# Patient Record
Sex: Female | Born: 1985 | Hispanic: No | Marital: Married | Smoking: Never smoker
Health system: Southern US, Community
[De-identification: ages and names within clinical notes are randomized; demographics above are authoritative.]

## PROBLEM LIST (undated history)

## (undated) DIAGNOSIS — E785 Hyperlipidemia, unspecified: Secondary | ICD-10-CM

## (undated) HISTORY — PX: NO PAST SURGERIES: SHX2092

## (undated) HISTORY — DX: Hyperlipidemia, unspecified: E78.5

---

## 2015-07-19 ENCOUNTER — Encounter (HOSPITAL_COMMUNITY): Payer: Self-pay | Admitting: Emergency Medicine

## 2015-07-19 ENCOUNTER — Emergency Department (HOSPITAL_COMMUNITY)
Admission: EM | Admit: 2015-07-19 | Discharge: 2015-07-20 | Disposition: A | Discharge status: Home / Self Care | Payer: PPO | Attending: Emergency Medicine | Admitting: Emergency Medicine

## 2015-07-19 DIAGNOSIS — J069 Acute upper respiratory infection, unspecified: Secondary | ICD-10-CM | POA: Diagnosis not present

## 2015-07-19 DIAGNOSIS — J9801 Acute bronchospasm: Secondary | ICD-10-CM | POA: Insufficient documentation

## 2015-07-19 DIAGNOSIS — R0602 Shortness of breath: Secondary | ICD-10-CM | POA: Diagnosis present

## 2015-07-19 NOTE — ED Provider Notes (Signed)
CSN: [id number]   Arrival date & time 07/19/15 2346  History  By signing my name below, I, [name], attest that this documentation has been prepared under the direction and in the presence of Julianne Rice, MD. Electronically Signed: [name], ED Scribe. 07/20/2015. 12:14 AM.  Chief Complaint  Patient presents with  . Cough  . Shortness of Breath    HPI The history is provided by the patient. No language interpreter was used.   Krystal Cook is a 29 y.o. female who presents to the Emergency Department complaining of constant SOB with onset tonight before bed. Associated symptoms include 1 day of dry cough, and sore throat. Pt denies fever and chills. No lower extremity swelling or pain.No known sick contact. Healthy otherwise.    History reviewed. No pertinent past medical history.  History reviewed. No pertinent past surgical history.  History reviewed. No pertinent family history.  Social History  Substance Use Topics  . Smoking status: Never Smoker   . Smokeless tobacco: None  . Alcohol Use: No     Review of Systems  Constitutional: Negative for fever and chills.  HENT: Positive for congestion and sore throat. Negative for sinus pressure.   Respiratory: Positive for cough and shortness of breath. Negative for wheezing.   Cardiovascular: Negative for chest pain.  Gastrointestinal: Negative for nausea, vomiting, abdominal pain, diarrhea and constipation.  Musculoskeletal: Negative for back pain, neck pain and neck stiffness.  Skin: Negative for rash and wound.  Neurological: Negative for dizziness, syncope, weakness, light-headedness, numbness and headaches.  All other systems reviewed and are negative.  Home Medications   Prior to Admission medications   Medication Sig Start Date End Date Taking? Authorizing Provider  OVER THE COUNTER MEDICATION Take 1 tablet by mouth daily.   Yes Historical Provider, MD  predniSONE (DELTASONE) 20 MG tablet 3 tabs po day  one, then 2 po daily x 4 days 07/20/15   Julianne Rice, MD    Allergies  Review of patient's allergies indicates no known allergies.  Triage Vitals: BP 117/64 mmHg  Pulse 73  Temp(Src) 97.8 F (36.6 C) (Oral)  Resp 16  SpO2 100%  Physical Exam  Constitutional: She is oriented to person, place, and time. She appears well-developed and well-nourished. No distress.  HENT:  Head: Normocephalic and atraumatic.  Mouth/Throat: Oropharynx is clear and moist. No oropharyngeal exudate.  Mildly erythematous bilaterally tonsillar hypertrophy. Bilateral nasal mucosal edema.  Eyes: EOM are normal. Pupils are equal, round, and reactive to light.  Neck: Normal range of motion. Neck supple.  Cardiovascular: Normal rate and regular rhythm.  Exam reveals no gallop and no friction rub.   No murmur heard. Pulmonary/Chest: Effort normal and breath sounds normal. No respiratory distress. She has no wheezes. She has no rales. She exhibits no tenderness.  Abdominal: Soft. Bowel sounds are normal. She exhibits no distension and no mass. There is no tenderness. There is no rebound and no guarding.  Musculoskeletal: Normal range of motion. She exhibits no edema or tenderness.  No calf swelling or tenderness.  Lymphadenopathy:    She has no cervical adenopathy.  Neurological: She is alert and oriented to person, place, and time.  Skin: Skin is warm and dry. No rash noted. No erythema.  Psychiatric: She has a normal mood and affect. Her behavior is normal.  Nursing note and vitals reviewed.   ED Course  Procedures   DIAGNOSTIC STUDIES: Oxygen Saturation is 100% on RA, normal by my interpretation.  COORDINATION OF CARE: 12:04 AM Discussed treatment plan which includes CXR with pt at bedside and pt agreed to plan.  Labs Reviewed - No data to display  Imaging Review Dg Chest 2 View  07/20/2015  CLINICAL DATA:  Sudden onset shortness of breath this morning. Cough and sore throat for 24 hours.  EXAM: CHEST  2 VIEW COMPARISON:  None. FINDINGS: The heart size and mediastinal contours are within normal limits. Both lungs are clear. The visualized skeletal structures are unremarkable. IMPRESSION: No active cardiopulmonary disease. Electronically Signed   By: Lucienne Capers M.D.   On: 07/20/2015 00:29    I personally reviewed and evaluated these images as a part of my medical decision-making.   MDM   Final diagnoses:  Bronchospasm  URI (upper respiratory infection)     I, [name], personally performed the services described in this documentation. All medical record entries made by the scribe were at my direction and in my presence.  I have reviewed the chart and discharge instructions and agree that the record reflects my personal performance and is accurate and complete. [place].  07/20/2015. 1:19 AM.   Chest x-ray without any evidence of pneumonia. Patient given epi oral nap in the emergency department with improvement of her symptoms. Shortness of breath likely due to bronchospasm. We'll treat with short course of steroids and given albuterol inhaler in the emergency department. Return precautions have been given. No suspicion for PE given infectious symptoms, normal pulse and normal oxygen saturations.   Julianne Rice, MD 07/20/15 [phone]

## 2015-07-19 NOTE — ED Notes (Signed)
Pt's friend states she "woke up and couldn't breathe." Pt on her phone during entire triage and would not answer questions related to why she presents to ED. Denies being on contraceptives measures. Has had cough recently. Unable to obtain other triage information. No other c/c. RR even/unlabored. No wheezing noted.

## 2015-07-20 ENCOUNTER — Emergency Department (HOSPITAL_COMMUNITY): Payer: PPO

## 2015-07-20 MED ORDER — PREDNISONE 20 MG PO TABS: ORAL_TABLET | ORAL | Status: DC

## 2015-07-20 MED ORDER — ALBUTEROL SULFATE (2.5 MG/3ML) 0.083% IN NEBU
2.5000 mg | INHALATION_SOLUTION | Freq: Once | RESPIRATORY_TRACT | Status: AC
  Administered 2015-07-20: 2.5 mg via RESPIRATORY_TRACT
  Filled 2015-07-20: qty 3

## 2015-07-20 MED ORDER — ALBUTEROL SULFATE HFA 108 (90 BASE) MCG/ACT IN AERS
1.0000 | INHALATION_SPRAY | RESPIRATORY_TRACT | Status: DC | PRN
  Administered 2015-07-20: 2 via RESPIRATORY_TRACT
  Filled 2015-07-20: qty 6.7

## 2015-07-20 NOTE — Discharge Instructions (Signed)
Bronchospasm, Adult A bronchospasm is a spasm or tightening of the airways going into the lungs. During a bronchospasm breathing becomes more difficult because the airways get smaller. When this happens there can be coughing, a whistling sound when breathing (wheezing), and difficulty breathing. Bronchospasm is often associated with asthma, but not all patients who experience a bronchospasm have asthma. CAUSES  A bronchospasm is caused by inflammation or irritation of the airways. The inflammation or irritation may be triggered by:   Allergies (such as to animals, pollen, food, or mold). Allergens that cause bronchospasm may cause wheezing immediately after exposure or many hours later.   Infection. Viral infections are believed to be the most common cause of bronchospasm.   Exercise.   Irritants (such as pollution, cigarette smoke, strong odors, aerosol sprays, and paint fumes).   Weather changes. Winds increase molds and pollens in the air. Rain refreshes the air by washing irritants out. Cold air may cause inflammation.   Stress and emotional upset.  SIGNS AND SYMPTOMS   Wheezing.   Excessive nighttime coughing.   Frequent or severe coughing with a simple cold.   Chest tightness.   Shortness of breath.  DIAGNOSIS  Bronchospasm is usually diagnosed through a history and physical exam. Tests, such as chest X-rays, are sometimes done to look for other conditions. TREATMENT   Inhaled medicines can be given to open up your airways and help you breathe. The medicines can be given using either an inhaler or a nebulizer machine.  Corticosteroid medicines may be given for severe bronchospasm, usually when it is associated with asthma. HOME CARE INSTRUCTIONS   Always have a plan prepared for seeking medical care. Know when to call your health care provider and local emergency services (911 in the [place] Know where you can access local emergency care.  Only take medicines as  directed by your health care provider.  If you were prescribed an inhaler or nebulizer machine, ask your health care provider to explain how to use it correctly. Always use a spacer with your inhaler if you were given one.  It is necessary to remain calm during an attack. Try to relax and breathe more slowly.  Control your home environment in the following ways:   Change your heating and air conditioning filter at least once a month.   Limit your use of fireplaces and wood stoves.  Do not smoke and do not allow smoking in your home.   Avoid exposure to perfumes and fragrances.   Get rid of pests (such as roaches and mice) and their droppings.   Throw away plants if you see mold on them.   Keep your house clean and dust free.   Replace carpet with wood, tile, or vinyl flooring. Carpet can trap dander and dust.   Use allergy-proof pillows, mattress covers, and box spring covers.   Wash bed sheets and blankets every week in hot water and dry them in a dryer.   Use blankets that are made of polyester or cotton.   Wash hands frequently. SEEK MEDICAL CARE IF:   You have muscle aches.   You have chest pain.   The sputum changes from clear or white to yellow, green, gray, or bloody.   The sputum you cough up gets thicker.   There are problems that may be related to the medicine you are given, such as a rash, itching, swelling, or trouble breathing.  SEEK IMMEDIATE MEDICAL CARE IF:   You have worsening wheezing and coughing  even after taking your prescribed medicines.   You have increased difficulty breathing.   You develop severe chest pain. MAKE SURE YOU:   Understand these instructions.  Will watch your condition.  Will get help right away if you are not doing well or get worse.   This information is not intended to replace advice given to you by your health care provider. Make sure you discuss any questions you have with your health care  provider.   Document Released: 09/25/2003 Document Revised: 10/13/2014 Document Reviewed: 03/14/2013 Elsevier Interactive Patient Education 2016 Elsevier Inc.  Upper Respiratory Infection, Adult Most upper respiratory infections (URIs) are a viral infection of the air passages leading to the lungs. A URI affects the nose, throat, and upper air passages. The most common type of URI is nasopharyngitis and is typically referred to as "the common cold." URIs run their course and usually go away on their own. Most of the time, a URI does not require medical attention, but sometimes a bacterial infection in the upper airways can follow a viral infection. This is called a secondary infection. Sinus and middle ear infections are common types of secondary upper respiratory infections. Bacterial pneumonia can also complicate a URI. A URI can worsen asthma and chronic obstructive pulmonary disease (COPD). Sometimes, these complications can require emergency medical care and may be life threatening.  CAUSES Almost all URIs are caused by viruses. A virus is a type of germ and can spread from one person to another.  RISKS FACTORS You may be at risk for a URI if:   You smoke.   You have chronic heart or lung disease.  You have a weakened defense (immune) system.   You are very young or very old.   You have nasal allergies or asthma.  You work in crowded or poorly ventilated areas.  You work in health care facilities or schools. SIGNS AND SYMPTOMS  Symptoms typically develop 2-3 days after you come in contact with a cold virus. Most viral URIs last 7-10 days. However, viral URIs from the influenza virus (flu virus) can last 14-18 days and are typically more severe. Symptoms may include:   Runny or stuffy (congested) nose.   Sneezing.   Cough.   Sore throat.   Headache.   Fatigue.   Fever.   Loss of appetite.   Pain in your forehead, behind your eyes, and over your cheekbones  (sinus pain).  Muscle aches.  DIAGNOSIS  Your health care provider may diagnose a URI by:  Physical exam.  Tests to check that your symptoms are not due to another condition such as:  Strep throat.  Sinusitis.  Pneumonia.  Asthma. TREATMENT  A URI goes away on its own with time. It cannot be cured with medicines, but medicines may be prescribed or recommended to relieve symptoms. Medicines may help:  Reduce your fever.  Reduce your cough.  Relieve nasal congestion. HOME CARE INSTRUCTIONS   Take medicines only as directed by your health care provider.   Gargle warm saltwater or take cough drops to comfort your throat as directed by your health care provider.  Use a warm mist humidifier or inhale steam from a shower to increase air moisture. This may make it easier to breathe.  Drink enough fluid to keep your urine clear or pale yellow.   Eat soups and other clear broths and maintain good nutrition.   Rest as needed.   Return to work when your temperature has returned to normal  or as your health care provider advises. You may need to stay home longer to avoid infecting others. You can also use a face mask and careful hand washing to prevent spread of the virus.  Increase the usage of your inhaler if you have asthma.   Do not use any tobacco products, including cigarettes, chewing tobacco, or electronic cigarettes. If you need help quitting, ask your health care provider. PREVENTION  The best way to protect yourself from getting a cold is to practice good hygiene.   Avoid oral or hand contact with people with cold symptoms.   Wash your hands often if contact occurs.  There is no clear evidence that vitamin C, vitamin E, echinacea, or exercise reduces the chance of developing a cold. However, it is always recommended to get plenty of rest, exercise, and practice good nutrition.  SEEK MEDICAL CARE IF:   You are getting worse rather than better.   Your  symptoms are not controlled by medicine.   You have chills.  You have worsening shortness of breath.  You have brown or red mucus.  You have yellow or brown nasal discharge.  You have pain in your face, especially when you bend forward.  You have a fever.  You have swollen neck glands.  You have pain while swallowing.  You have white areas in the back of your throat. SEEK IMMEDIATE MEDICAL CARE IF:   You have severe or persistent:  Headache.  Ear pain.  Sinus pain.  Chest pain.  You have chronic lung disease and any of the following:  Wheezing.  Prolonged cough.  Coughing up blood.  A change in your usual mucus.  You have a stiff neck.  You have changes in your:  Vision.  Hearing.  Thinking.  Mood. MAKE SURE YOU:   Understand these instructions.  Will watch your condition.  Will get help right away if you are not doing well or get worse.   This information is not intended to replace advice given to you by your health care provider. Make sure you discuss any questions you have with your health care provider.   Document Released: 03/18/2001 Document Revised: 02/06/2015 Document Reviewed: 12/28/2013 Elsevier Interactive Patient Education [employer].

## 2015-08-09 ENCOUNTER — Other Ambulatory Visit (HOSPITAL_COMMUNITY)
Admission: RE | Admit: 2015-08-09 | Discharge: 2015-08-09 | Disposition: A | Discharge status: Home / Self Care | Payer: PPO | Source: Ambulatory Visit | Attending: Nurse Practitioner | Admitting: Nurse Practitioner

## 2015-08-09 DIAGNOSIS — Z01419 Encounter for gynecological examination (general) (routine) without abnormal findings: Secondary | ICD-10-CM | POA: Diagnosis present

## 2015-08-09 DIAGNOSIS — Z113 Encounter for screening for infections with a predominantly sexual mode of transmission: Secondary | ICD-10-CM | POA: Diagnosis present

## 2017-06-04 ENCOUNTER — Ambulatory Visit (INDEPENDENT_AMBULATORY_CARE_PROVIDER_SITE_OTHER): Payer: PPO | Admitting: Family Medicine

## 2017-06-04 ENCOUNTER — Encounter: Payer: Self-pay | Admitting: Family Medicine

## 2017-06-04 VITALS — BP 110/70 | HR 81 | Temp 98.3°F | Ht 59.0 in | Wt 146.6 lb

## 2017-06-04 DIAGNOSIS — Z833 Family history of diabetes mellitus: Secondary | ICD-10-CM | POA: Insufficient documentation

## 2017-06-04 DIAGNOSIS — F4320 Adjustment disorder, unspecified: Secondary | ICD-10-CM

## 2017-06-04 DIAGNOSIS — R635 Abnormal weight gain: Secondary | ICD-10-CM | POA: Diagnosis not present

## 2017-06-04 DIAGNOSIS — R5383 Other fatigue: Secondary | ICD-10-CM | POA: Diagnosis not present

## 2017-06-04 DIAGNOSIS — E785 Hyperlipidemia, unspecified: Secondary | ICD-10-CM | POA: Insufficient documentation

## 2017-06-04 DIAGNOSIS — R350 Frequency of micturition: Secondary | ICD-10-CM

## 2017-06-04 DIAGNOSIS — F4321 Adjustment disorder with depressed mood: Secondary | ICD-10-CM | POA: Insufficient documentation

## 2017-06-04 LAB — CBC WITH DIFFERENTIAL/PLATELET
BASOS ABS: 0 {cells}/uL (ref 0–200)
Basophils Relative: 0 %
EOS ABS: 212 {cells}/uL (ref 15–500)
Eosinophils Relative: 2 %
HCT: 37.7 % (ref 35.0–45.0)
Hemoglobin: 12.4 g/dL (ref 11.7–15.5)
Lymphocytes Relative: 27 %
Lymphs Abs: 2862 cells/uL (ref 850–3900)
MCH: 29 pg (ref 27.0–33.0)
MCHC: 32.9 g/dL (ref 32.0–36.0)
MCV: 88.1 fL (ref 80.0–100.0)
MONOS PCT: 7 %
MPV: 9.7 fL (ref 7.5–12.5)
Monocytes Absolute: 742 cells/uL (ref 200–950)
Neutro Abs: 6784 cells/uL (ref 1500–7800)
Neutrophils Relative %: 64 %
PLATELETS: 451 10*3/uL — AB (ref 140–400)
RBC: 4.28 MIL/uL (ref 3.80–5.10)
RDW: 14.2 % (ref 11.0–15.0)
WBC: 10.6 10*3/uL — ABNORMAL HIGH (ref 4.0–10.5)

## 2017-06-04 LAB — POCT URINALYSIS DIP (PROADVANTAGE DEVICE)
BILIRUBIN UA: NEGATIVE mg/dL
Bilirubin, UA: NEGATIVE
Glucose, UA: NEGATIVE mg/dL
LEUKOCYTES UA: NEGATIVE
NITRITE UA: NEGATIVE
PH UA: 6 (ref 5.0–8.0)
Protein Ur, POC: NEGATIVE mg/dL
RBC UA: NEGATIVE
SPECIFIC GRAVITY, URINE: 1.025
UUROB: NEGATIVE

## 2017-06-04 NOTE — Progress Notes (Signed)
   Subjective:    Patient ID: Krystal Cook, female    DOB: August 20, 1986, 31 y.o.   MRN: [id number]  HPI Chief Complaint  Patient presents with  . new pt    new pt not feel well. been going on for a long time but within the last 2 weeks, exhausted   She is new to the practice and here with complaints of fatigue. States she went to a clinic last year for the same complaint.  She is originally from [place].   States she sleeps well but wakes up feeling tired.   States her diet is poor and eats a lot of fast food. Does not exercise. States she has gained 20 lbs since moving to the [place] 4 years ago.   Diabetes runs in her family and she is concerned she might have diabetes. Reports urinary frequency.   States her brother died this year and she has not dealt with his passing. She is quite sad today. Denies SI or HI.   LMP: 1 week ago and regular.   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 Has been at [employer] for 4 years working on her Masters.    Denies fever, chills, dizziness, chest pain, palpitations, shortness of breath, abdominal pain, N/V/D, LE edema.    Depression screen PHQ 2/9 06/04/2017  Decreased Interest 0  Down, Depressed, Hopeless 0  PHQ - 2 Score 0   Reviewed allergies, medications, past medical, surgical, family, and social history.    Review of Systems Pertinent positives and negatives in the history of present illness.     Objective:   Physical Exam BP 110/70   Pulse 81   Temp 98.3 F (36.8 C) (Oral)   Ht 4\' 11"  (1.499 m)   Wt 146 lb 9.6 oz (66.5 kg)   LMP 05/28/2017   SpO2 99%   BMI 29.61 kg/m   Alert and in no distress.  Pharyngeal area is normal. Neck is supple without adenopathy or thyromegaly. Cardiac exam shows a regular sinus rhythm without murmurs or gallops. Lungs are clear to auscultation. Extremities without edema.       Assessment & Plan:  Fatigue, unspecified type - Plan: CBC with Differential/Platelet,  Comprehensive metabolic panel, VITAMIN D 25 Hydroxy (Vit-D Deficiency, Fractures), TSH, Hemoglobin A1c  Hyperlipidemia, unspecified hyperlipidemia type - Plan: Lipid panel  Grief reaction  Urinary frequency - Plan: POCT Urinalysis DIP (Proadvantage Device), Hemoglobin A1c  Family history of diabetes mellitus in first degree relative - Plan: Hemoglobin A1c  Weight gain - Plan: Comprehensive metabolic panel, TSH  Discussed possible etiologies for fatigue including grief and depression. Will check labs. Discussed taking better care of herself by eating a healthy diet and avoiding fast food. Advised to increase exercise.  Recommend that she start counseling to deal with grief from loss of her brother.  No sign of UTI. Will check for diabetes based on family history of urinary frequency. Weight gain appears to be due to poor diet since moving here for school.  Will follow up pending labs.

## 2017-06-04 NOTE — Patient Instructions (Signed)
You can call to schedule your appointment with the counselor. A few offices are listed below for you to call.   [facility]  [place], [place], [place] [address]  Phone: [phone]  [facility] [address] [place], [place] [address]  Phone: [phone]  [facility]  [address], [place], [place] [address] Phone: [phone]

## 2017-06-05 ENCOUNTER — Encounter: Payer: Self-pay | Admitting: Family Medicine

## 2017-06-05 ENCOUNTER — Other Ambulatory Visit: Payer: Self-pay | Admitting: Family Medicine

## 2017-06-05 DIAGNOSIS — E559 Vitamin D deficiency, unspecified: Secondary | ICD-10-CM

## 2017-06-05 DIAGNOSIS — D75839 Thrombocytosis, unspecified: Secondary | ICD-10-CM | POA: Insufficient documentation

## 2017-06-05 DIAGNOSIS — D473 Essential (hemorrhagic) thrombocythemia: Secondary | ICD-10-CM | POA: Insufficient documentation

## 2017-06-05 LAB — HEMOGLOBIN A1C
HEMOGLOBIN A1C: 5.1 % (ref <5.7)
Mean Plasma Glucose: 100 mg/dL

## 2017-06-05 LAB — COMPREHENSIVE METABOLIC PANEL
ALBUMIN: 4.2 g/dL (ref 3.6–5.1)
ALT: 12 U/L (ref 6–29)
AST: 19 U/L (ref 10–30)
Alkaline Phosphatase: 94 U/L (ref 33–115)
BUN: 11 mg/dL (ref 7–25)
CO2: 23 mmol/L (ref 20–32)
Calcium: 9.4 mg/dL (ref 8.6–10.2)
Chloride: 102 mmol/L (ref 98–110)
Creat: 0.63 mg/dL (ref 0.50–1.10)
Glucose, Bld: 91 mg/dL (ref 65–99)
POTASSIUM: 4.2 mmol/L (ref 3.5–5.3)
Sodium: 139 mmol/L (ref 135–146)
TOTAL PROTEIN: 7 g/dL (ref 6.1–8.1)
Total Bilirubin: 0.2 mg/dL (ref 0.2–1.2)

## 2017-06-05 LAB — LIPID PANEL
Cholesterol: 184 mg/dL (ref <200)
HDL: 65 mg/dL (ref >50)
LDL CALC: 97 mg/dL (ref <100)
Total CHOL/HDL Ratio: 2.8 Ratio (ref <5.0)
Triglycerides: 111 mg/dL (ref <150)
VLDL: 22 mg/dL (ref <30)

## 2017-06-05 LAB — TSH: TSH: 1.17 mIU/L

## 2017-06-05 LAB — VITAMIN D 25 HYDROXY (VIT D DEFICIENCY, FRACTURES): Vit D, 25-Hydroxy: 15 ng/mL — ABNORMAL LOW (ref 30–100)

## 2017-06-05 MED ORDER — VITAMIN D (ERGOCALCIFEROL) 1.25 MG (50000 UNIT) PO CAPS: 50000.0000 [IU] | ORAL_CAPSULE | ORAL | 0 refills | Status: DC

## 2017-06-11 ENCOUNTER — Encounter: Payer: Self-pay | Admitting: Family Medicine

## 2017-06-11 ENCOUNTER — Ambulatory Visit (INDEPENDENT_AMBULATORY_CARE_PROVIDER_SITE_OTHER): Payer: PPO | Admitting: Family Medicine

## 2017-06-11 VITALS — BP 110/60 | HR 98 | Wt 145.8 lb

## 2017-06-11 DIAGNOSIS — R140 Abdominal distension (gaseous): Secondary | ICD-10-CM | POA: Diagnosis not present

## 2017-06-11 DIAGNOSIS — K5900 Constipation, unspecified: Secondary | ICD-10-CM | POA: Diagnosis not present

## 2017-06-11 DIAGNOSIS — F4320 Adjustment disorder, unspecified: Secondary | ICD-10-CM

## 2017-06-11 DIAGNOSIS — M2141 Flat foot [pes planus] (acquired), right foot: Secondary | ICD-10-CM | POA: Diagnosis not present

## 2017-06-11 DIAGNOSIS — M79671 Pain in right foot: Secondary | ICD-10-CM | POA: Diagnosis not present

## 2017-06-11 DIAGNOSIS — F4321 Adjustment disorder with depressed mood: Secondary | ICD-10-CM

## 2017-06-11 DIAGNOSIS — E559 Vitamin D deficiency, unspecified: Secondary | ICD-10-CM | POA: Diagnosis not present

## 2017-06-11 NOTE — Patient Instructions (Addendum)
If your stools are hard then try adding fiber con or a similar medication and stool softener such as Colace or the generic. You can ask the pharmacy staff to help picking these out.  Eat small meals and do not overload your stomach. Be aware of gas producing foods such as raw vegetables, onions for example.  Increase water intake and increase your physical activity.   The podiatrist (foot doctor) will call you to schedule an appointment.   Start taking your vitamin D once weekly and return for a lab visit in 9 weeks.    Abdominal Bloating When you have abdominal bloating, your abdomen may feel full, tight, or painful. It may also look bigger than normal or swollen (distended). Common causes of abdominal bloating include:  Swallowing air.  Constipation.  Problems digesting food.  Eating too much.  Irritable bowel syndrome. This is a condition that affects the large intestine.  Lactose intolerance. This is an inability to digest lactose, a natural sugar in dairy products.  Celiac disease. This is a condition that affects the ability to digest gluten, a protein found in some grains.  Gastroparesis. This is a condition that slows down the movement of food in the stomach and small intestine. It is more common in people with diabetes mellitus.  Gastroesophageal reflux disease (GERD). This is a digestive condition that makes stomach acid flow back into the esophagus.  Urinary retention. This means that the body is holding onto urine, and the bladder cannot be emptied all the way.  Follow these instructions at home: Eating and drinking  Avoid eating too much.  Try not to swallow air while talking or eating.  Avoid eating while lying down.  Avoid these foods and drinks: ? Foods that cause gas, such as broccoli, cabbage, cauliflower, and baked beans. ? Carbonated drinks. ? Hard candy. ? Chewing gum. Medicines  Take over-the-counter and prescription medicines only as told by your  health care provider.  Take probiotic medicines. These medicines contain live bacteria or yeasts that can help digestion.  Take coated peppermint oil capsules. Activity  Try to exercise regularly. Exercise may help to relieve bloating that is caused by gas and relieve constipation. General instructions  Keep all follow-up visits as told by your health care provider. This is important. Contact a health care provider if:  You have nausea and vomiting.  You have diarrhea.  You have abdominal pain.  You have unusual weight loss or weight gain.  You have severe pain, and medicines do not help. Get help right away if:  You have severe chest pain.  You have trouble breathing.  You have shortness of breath.  You have trouble urinating.  You have darker urine than normal.  You have blood in your stools or have dark, tarry stools. Summary  Abdominal bloating means that the abdomen is swollen.  Common causes of abdominal bloating are swallowing air, constipation, and problems digesting food.  Avoid eating too much and avoid swallowing air.  Avoid foods that cause gas, carbonated drinks, hard candy, and chewing gum. This information is not intended to replace advice given to you by your health care provider. Make sure you discuss any questions you have with your health care provider. Document Released: 10/24/2016 Document Revised: 10/24/2016 Document Reviewed: 10/24/2016 Elsevier Interactive Patient Education  [employer].

## 2017-06-11 NOTE — Progress Notes (Signed)
   Subjective:    Patient ID: Krystal Cook, female    DOB: [birth year], 31 y.o.   MRN: [id number]  HPI Chief Complaint  Patient presents with  . follow-up    follow-up on blood work   States she is here to follow up on her abnormal labs.  Her vitamin D is low and she has not started taking vitamin D supplement yet. She plans to pick up this prescription today.   She complains of right foot pain for several years. States she has flat feet that is worse with her right foot. States she has been wearing arch supports but certain activities make her pain worse. She would like to see a foot specialist.   Also complains of feeling bloated and having a lot of gas. This has been ongoing for at least a year but seems to be getting worse. States her stools have been harder and she is having constipation issues. States she does not eat a lot of fiber. States her diet is high in rice, garlic and onions. Denies nausea, vomiting, blood in stool. No urinary symptoms.  Denies ever being sexually active and no chance of pregnancy.   She is still dealing with the recent death of her brother. Has not scheduled a counseling appointment yet. States she is feeling less sad and keeping busy. She may see a counselor but is not sure.   Denies fever, chills, dizziness, chest pain, palpitations, shortness of breath, abdominal pain, N/V/D.     Review of Systems Pertinent positives and negatives in the history of present illness.     Objective:   Physical Exam  Constitutional: She appears well-developed and well-nourished. No distress.  Neck: Normal range of motion. Neck supple.  Cardiovascular: Normal rate, regular rhythm and intact distal pulses.   Pulmonary/Chest: Effort normal and breath sounds normal.  Abdominal: Soft. Bowel sounds are normal. She exhibits no distension. There is no hepatosplenomegaly. There is no tenderness. There is no rigidity, no rebound, no guarding, no CVA tenderness, no tenderness at  McBurney's point and negative Murphy's sign.  Musculoskeletal:       Right foot: There is normal range of motion, no tenderness and normal capillary refill.  Pes planus but otherwise normal exam of right foot.   Skin: Skin is warm and dry. No rash noted. No pallor.  Psychiatric: She has a normal mood and affect. Her speech is normal and behavior is normal. Thought content normal.   BP 110/60   Pulse 98   Wt 145 lb 12.8 oz (66.1 kg)   LMP 05/28/2017   BMI 29.45 kg/m      Assessment & Plan:  Vitamin D deficiency  Grief  Pes planus of right foot - Plan: Ambulatory referral to Podiatry  Bloating  Constipation, unspecified constipation type  Right foot pain - Plan: Ambulatory referral to Podiatry  She will start vitamin D supplement and f/u in 9 weeks.  Her mood appears to be better. She may call and schedule a counseling visit, she has the information.  Referral to podiatry for right foot pain and pes planus per patient request.  Discussed avoiding gas producing foods, increasing water and physical activity and trying Gas-X or stool softeners, fiber con.  She will follow up for lab visit and let me know if her symptoms are worsening or not improving.

## 2017-07-03 ENCOUNTER — Ambulatory Visit (INDEPENDENT_AMBULATORY_CARE_PROVIDER_SITE_OTHER): Payer: PPO | Admitting: Podiatry

## 2017-07-03 ENCOUNTER — Encounter: Payer: Self-pay | Admitting: Podiatry

## 2017-07-03 ENCOUNTER — Ambulatory Visit (INDEPENDENT_AMBULATORY_CARE_PROVIDER_SITE_OTHER): Payer: PPO

## 2017-07-03 VITALS — BP 98/59 | HR 83 | Resp 16

## 2017-07-03 DIAGNOSIS — M2141 Flat foot [pes planus] (acquired), right foot: Secondary | ICD-10-CM

## 2017-07-03 DIAGNOSIS — M2142 Flat foot [pes planus] (acquired), left foot: Secondary | ICD-10-CM

## 2017-07-03 DIAGNOSIS — M659 Synovitis and tenosynovitis, unspecified: Secondary | ICD-10-CM | POA: Diagnosis not present

## 2017-07-03 NOTE — Progress Notes (Signed)
  Subjective:  Patient ID: Krystal Cook, female    DOB: 01/19/1986,  MRN: [id number] HPI Chief Complaint  Patient presents with  . Foot Pain    Medial foot bilateral-right over left x 4 years, increased pain wtih exercise, swelling left, tried OTC insoles but thought they were uncomfortable    31 y.o. female presents with the above complaint. Reports medial arch pain bilateral feet 4 years duration. States the pain is worse with exercise. Has tried over-the-counter orthotics cannot remember the name of the orthotics and states that they were uncomfortable hurt her more. Denies other issues  Past Medical History:  Diagnosis Date  . Hyperlipemia    No past surgical history on file.  Current Outpatient Prescriptions:  .  Omega-3 Fatty Acids (FISH OIL PO), Take by mouth., Disp: , Rfl:  .  Vitamin D, Ergocalciferol, (DRISDOL) 50000 units CAPS capsule, Take 1 capsule (50,000 Units total) by mouth every 7 (seven) days., Disp: 8 capsule, Rfl: 0  No Known Allergies Review of Systems  All other systems reviewed and are negative.  Objective:   Vitals:   07/03/17 1045  BP: (!) 98/59  Pulse: 83  Resp: 16   General AA&O x3. Normal mood and affect.  Vascular Dorsalis pedis and posterior tibial pulses 2/4 bilat. Brisk capillary refill to all digits. Pedal hair present.  Neurologic Epicritic sensation grossly intact.  Dermatologic No open lesions. Interspaces clear of maceration. Nails well groomed and normal in appearance.  Orthopedic: MMT 5/5 in dorsiflexion, plantarflexion, inversion, and eversion. Normal joint ROM without pain or crepitus.   Radiographs: Taken and reviewed. No acute fractures or dislocations. No other osseous abnormalities. Assessment & Plan:  Patient was evaluated and treated and all questions answered.  Bilateral pes planus with arch pain -No pain to palpation of the posterior tibial tendon. Likely overuse during exercise -Discussed over-the-counter orthotics  with patient -Power steps dispensed.   Follow-up in 1 month

## 2017-08-06 ENCOUNTER — Ambulatory Visit (INDEPENDENT_AMBULATORY_CARE_PROVIDER_SITE_OTHER): Payer: PPO | Admitting: Podiatry

## 2017-08-06 DIAGNOSIS — M2142 Flat foot [pes planus] (acquired), left foot: Secondary | ICD-10-CM

## 2017-08-06 DIAGNOSIS — M2141 Flat foot [pes planus] (acquired), right foot: Secondary | ICD-10-CM

## 2017-08-06 DIAGNOSIS — M659 Synovitis and tenosynovitis, unspecified: Secondary | ICD-10-CM

## 2017-08-06 NOTE — Progress Notes (Signed)
  Subjective:  Patient ID: Krystal Cook, female    DOB: July 01, 1986,  MRN: [id number]  Chief Complaint  Patient presents with  . Foot Pain    Follow up pes planus bilateral   "Its better with the supports"   31 y.o. female returns for the above complaint. States her pain is better with the supports. Denies pain. Denies other issues.  Objective:  There were no vitals filed for this visit. General AA&O x3. Normal mood and affect.  Vascular Pedal pulses palpable.  Neurologic Epicritic sensation grossly intact.  Dermatologic No open lesions. Skin normal texture and turgor.  Orthopedic: No pain to palpation either foot.   Assessment & Plan:  Patient was evaluated and treated and all questions answered.  Pes planus bilateral -Improved with orthotics -No new issues.  Return if symptoms worsen or fail to improve.

## 2017-08-14 ENCOUNTER — Ambulatory Visit: Payer: PPO | Admitting: Podiatry

## 2017-08-18 ENCOUNTER — Other Ambulatory Visit: Payer: PPO

## 2017-08-18 DIAGNOSIS — D75839 Thrombocytosis, unspecified: Secondary | ICD-10-CM

## 2017-08-18 DIAGNOSIS — D473 Essential (hemorrhagic) thrombocythemia: Secondary | ICD-10-CM

## 2017-08-18 DIAGNOSIS — E559 Vitamin D deficiency, unspecified: Secondary | ICD-10-CM

## 2017-08-19 LAB — CBC WITH DIFFERENTIAL/PLATELET
Basophils Absolute: 53 cells/uL (ref 0–200)
Basophils Relative: 0.7 %
EOS ABS: 160 {cells}/uL (ref 15–500)
Eosinophils Relative: 2.1 %
HCT: 37.4 % (ref 35.0–45.0)
HEMOGLOBIN: 12.8 g/dL (ref 11.7–15.5)
Lymphs Abs: 2037 cells/uL (ref 850–3900)
MCH: 29.2 pg (ref 27.0–33.0)
MCHC: 34.2 g/dL (ref 32.0–36.0)
MCV: 85.4 fL (ref 80.0–100.0)
MONOS PCT: 7 %
MPV: 10.6 fL (ref 7.5–12.5)
NEUTROS ABS: 4818 {cells}/uL (ref 1500–7800)
Neutrophils Relative %: 63.4 %
Platelets: 390 10*3/uL (ref 140–400)
RBC: 4.38 10*6/uL (ref 3.80–5.10)
RDW: 12.8 % (ref 11.0–15.0)
Total Lymphocyte: 26.8 %
WBC mixed population: 532 cells/uL (ref 200–950)
WBC: 7.6 10*3/uL (ref 3.8–10.8)

## 2017-08-19 LAB — VITAMIN D 25 HYDROXY (VIT D DEFICIENCY, FRACTURES): Vit D, 25-Hydroxy: 53 ng/mL (ref 30–100)

## 2018-01-15 ENCOUNTER — Encounter: Payer: Self-pay | Admitting: Family Medicine

## 2018-01-15 ENCOUNTER — Ambulatory Visit (INDEPENDENT_AMBULATORY_CARE_PROVIDER_SITE_OTHER): Payer: PPO | Admitting: Family Medicine

## 2018-01-15 VITALS — BP 120/70 | HR 62 | Temp 98.3°F | Ht 59.25 in | Wt 145.0 lb

## 2018-01-15 DIAGNOSIS — H61891 Other specified disorders of right external ear: Secondary | ICD-10-CM

## 2018-01-15 DIAGNOSIS — R197 Diarrhea, unspecified: Secondary | ICD-10-CM

## 2018-01-15 DIAGNOSIS — R0981 Nasal congestion: Secondary | ICD-10-CM | POA: Diagnosis not present

## 2018-01-15 NOTE — Progress Notes (Signed)
   Subjective:    Patient ID: Krystal Cook, female    DOB: 09/10/1986, 32 y.o.   MRN: [id number]  HPI Chief Complaint  Patient presents with  . diarrhea    diarrhea for 4 weeks. constant this week but usually comes and goes, no stomach pain. also having a blood clot in nose   She is here with complaints of diarrhea for the past 4 weeks. States diarrhea is watery and occurs 3 or more times per day. Denies fever, chills, chest pain, abdominal pain, N/V. No blood or pus in stool.   Denies recent antibiotics or new foods.  She is taking Beano occasionally.  Recent travel to [place] prior to onset of diarrhea.   She also complains of right nostril being dry and irritated as well as her right ear canal itching. She has used olive oil in her right nostril. Denies epistaxis.   LMP: last week.   Reviewed allergies, medications, past medical, surgical, family, and social history.   Review of Systems Pertinent positives and negatives in the history of present illness.     Objective:   Physical Exam BP 120/70   Pulse 62   Temp 98.3 F (36.8 C) (Oral)   Ht 4' 11.25" (1.505 m)   Wt 145 lb (65.8 kg)   SpO2 98%   BMI 29.04 kg/m  Alert and in no distress. No sinus tenderness. Right nare erythematous, no edema or drainage. Left nare is normal. Tympanic membranes are normal, right ear canal is dry and mildly erythematous and left canal is normal. Pharyngeal area is normal. Neck is supple without adenopathy or thyromegaly. Cardiac exam shows a regular sinus rhythm without murmurs or gallops. Lungs are clear to auscultation.  Abdomen is soft, nondistended, normal bowel sounds, nontender, no palpable masses, no guarding or rebound.  Skin is warm and dry, no pallor.      Assessment & Plan:  Diarrhea, unspecified type - Plan: CBC with Differential/Platelet, Comprehensive metabolic panel, Cdiff NAA+O+P+Stool Culture, Cdiff NAA+O+P+Stool Culture  Nasal congestion  Dryness of right ear  canal  Will have her start on probiotic and check stool studies. No obvious infectious process. She is hemodynamically stable.  Abdominal exam is benign. Check CBC, CMP. Stay well hydrated.  Sample of saline nasal gel given to use in right nare and right ear canal.  Follow up pending labs.

## 2018-01-15 NOTE — Patient Instructions (Addendum)
Start taking an over the counter probiotic. Stay well hydrated. Return the stool specimens as discussed.  We will call you with your lab results.   Use the saline gel for your right nose and right ear canal.

## 2018-01-16 LAB — COMPREHENSIVE METABOLIC PANEL
ALBUMIN: 4.4 g/dL (ref 3.5–5.5)
ALT: 14 IU/L (ref 0–32)
AST: 25 IU/L (ref 0–40)
Albumin/Globulin Ratio: 1.6 (ref 1.2–2.2)
Alkaline Phosphatase: 100 IU/L (ref 39–117)
BUN / CREAT RATIO: 27 — AB (ref 9–23)
BUN: 14 mg/dL (ref 6–20)
Bilirubin Total: <0.2 mg/dL (ref 0.0–1.2)
CO2: 26 mmol/L (ref 20–29)
CREATININE: 0.51 mg/dL — AB (ref 0.57–1.00)
Calcium: 9.9 mg/dL (ref 8.7–10.2)
Chloride: 100 mmol/L (ref 96–106)
GFR calc Af Amer: 148 mL/min/{1.73_m2} (ref >59)
GFR calc non Af Amer: 129 mL/min/{1.73_m2} (ref >59)
GLOBULIN, TOTAL: 2.7 g/dL (ref 1.5–4.5)
Glucose: 78 mg/dL (ref 65–99)
Potassium: 4.6 mmol/L (ref 3.5–5.2)
SODIUM: 138 mmol/L (ref 134–144)
Total Protein: 7.1 g/dL (ref 6.0–8.5)

## 2018-01-16 LAB — CBC WITH DIFFERENTIAL/PLATELET
BASOS: 1 %
Basophils Absolute: 0.1 10*3/uL (ref 0.0–0.2)
EOS (ABSOLUTE): 2.4 10*3/uL — AB (ref 0.0–0.4)
Eos: 21 %
HEMATOCRIT: 37.5 % (ref 34.0–46.6)
Hemoglobin: 12.5 g/dL (ref 11.1–15.9)
IMMATURE GRANULOCYTES: 0 %
Immature Grans (Abs): 0 10*3/uL (ref 0.0–0.1)
LYMPHS ABS: 3.1 10*3/uL (ref 0.7–3.1)
Lymphs: 27 %
MCH: 29.6 pg (ref 26.6–33.0)
MCHC: 33.3 g/dL (ref 31.5–35.7)
MCV: 89 fL (ref 79–97)
MONOS ABS: 0.7 10*3/uL (ref 0.1–0.9)
Monocytes: 7 %
NEUTROS PCT: 44 %
Neutrophils Absolute: 5 10*3/uL (ref 1.4–7.0)
Platelets: 421 10*3/uL — ABNORMAL HIGH (ref 150–379)
RBC: 4.23 x10E6/uL (ref 3.77–5.28)
RDW: 13.8 % (ref 12.3–15.4)
WBC: 11.4 10*3/uL — AB (ref 3.4–10.8)

## 2018-01-22 LAB — CDIFF NAA+O+P+STOOL CULTURE
CDIFFPCR: NEGATIVE
E COLI SHIGA TOXIN ASSAY: NEGATIVE
E COLI SHIGA TOXIN ASSAY: NEGATIVE
Toxigenic C. Difficile by PCR: NEGATIVE

## 2018-01-25 NOTE — Progress Notes (Signed)
Patient stated that her diarrhea symptoms did not improve. She has an appointment schedule for tomorrow.

## 2018-01-26 ENCOUNTER — Encounter: Payer: Self-pay | Admitting: Family Medicine

## 2018-01-26 ENCOUNTER — Encounter: Payer: Self-pay | Admitting: Gastroenterology

## 2018-01-26 ENCOUNTER — Ambulatory Visit (INDEPENDENT_AMBULATORY_CARE_PROVIDER_SITE_OTHER): Payer: PPO | Admitting: Family Medicine

## 2018-01-26 VITALS — BP 120/70 | HR 65 | Temp 98.3°F | Wt 142.4 lb

## 2018-01-26 DIAGNOSIS — J310 Chronic rhinitis: Secondary | ICD-10-CM | POA: Diagnosis not present

## 2018-01-26 DIAGNOSIS — M542 Cervicalgia: Secondary | ICD-10-CM | POA: Diagnosis not present

## 2018-01-26 DIAGNOSIS — R197 Diarrhea, unspecified: Secondary | ICD-10-CM | POA: Diagnosis not present

## 2018-01-26 MED ORDER — MUPIROCIN 2 % EX OINT: 1.0000 "application " | TOPICAL_OINTMENT | Freq: Two times a day (BID) | CUTANEOUS | 0 refills | Status: AC

## 2018-01-26 NOTE — Progress Notes (Signed)
   Subjective:    Patient ID: Krystal Cook, female    DOB: 10-13-1985, 32 y.o.   MRN: [id number]  HPI Chief Complaint  Patient presents with  . diarrhea issues    symptoms not improving. its not the food cause she did ok today with eating  . neck pain    neck pain.   She is here with complaints of persistent diarrhea. States she continues having watery brown stools 3 times per day.  No changes in her diet or medications. No new symptoms.  States she feels tired but denies pain.  Drinking plenty of water.   Mildly elevated WBC count, she is concerned about this and would like to have it rechecked.  LMP: 3 weeks ago.   She also complains of her right nare being sore, inflamed and swollen. We addressed this at her previous visit and she was given saline nasal gel to try. States symptoms are no better but not worse.   She has a new complaint of posterior bilateral neck soreness and tightness for the past 2 weeks. Denies injury. Pain is with rotation mainly. She has tried a topical analgesic. Questions whether she should have a massage or PT.  Denies fever, chills, headache, vision changes, dizziness, chest pain, abdominal pain, N/V.   Reviewed allergies, medications, past medical, surgical, family, and social history.    Review of Systems Pertinent positives and negatives in the history of present illness.     Objective:   Physical Exam  Constitutional: She is oriented to person, place, and time. She appears well-developed and well-nourished. No distress.  HENT:  Nose: Mucosal edema present.  Mouth/Throat: Uvula is midline, oropharynx is clear and moist and mucous membranes are normal.  Right anterior nasal dryness and erythema   Neck: Trachea normal and normal range of motion. Neck supple. Muscular tenderness present. No spinous process tenderness present.  Bilateral posterior neck pain with rotation but none with flexion or extension, cervical paraspinal muscle TTP.     Neurological: She is alert and oriented to person, place, and time. She has normal strength. No cranial nerve deficit or sensory deficit.  Skin: Skin is warm and dry. No rash noted. No pallor.   BP 120/70   Pulse 65   Temp 98.3 F (36.8 C) (Oral)   Wt 142 lb 6.4 oz (64.6 kg)   SpO2 98%   BMI 28.52 kg/m       Assessment & Plan:  Diarrhea, unspecified type - Plan: Ambulatory referral to Gastroenterology, CBC with Differential/Platelet  Rhinitis, unspecified type - Plan: mupirocin ointment (BACTROBAN) 2 %  Bilateral neck pain  Discussed that her stool studies were negative.  Diarrhea persistent, occurring on average 3 times daily with no change in symptoms. No new foods or medications.  She is hemodynamically stable.  I will refer her to GI for further evaluation. She has been using saline nasal gel for rhinitis and this is not improving her symptoms.  Mupirocin prescribed. Neck pain is musculoskeletal and appears to be stress related.  Advised her to try using heat, gentle stretches and topical analgesic if needed.  She does not want to take oral anti-inflammatories.  She may also look into getting a massage. Recommend using good body mechanics.  She will follow-up if not improving.

## 2018-01-26 NOTE — Patient Instructions (Signed)
Use heat to your neck, do gentle stretches and you may also use a topical analgesic.   Get a message if you would like. [employer] may be more affordable.   We will call you with your lab results.   As discussed, your stool studies are normal. Keep an eye on any food triggers for your diarrhea.   You will receive a call from the GI office. Make sure you are staying well hydrated.

## 2018-01-27 LAB — CBC WITH DIFFERENTIAL/PLATELET
BASOS: 0 %
Basophils Absolute: 0 10*3/uL (ref 0.0–0.2)
EOS (ABSOLUTE): 1.6 10*3/uL — AB (ref 0.0–0.4)
EOS: 18 %
HEMOGLOBIN: 12.5 g/dL (ref 11.1–15.9)
Hematocrit: 37 % (ref 34.0–46.6)
IMMATURE GRANS (ABS): 0 10*3/uL (ref 0.0–0.1)
Immature Granulocytes: 0 %
LYMPHS: 26 %
Lymphocytes Absolute: 2.2 10*3/uL (ref 0.7–3.1)
MCH: 29.7 pg (ref 26.6–33.0)
MCHC: 33.8 g/dL (ref 31.5–35.7)
MCV: 88 fL (ref 79–97)
MONOCYTES: 7 %
Monocytes Absolute: 0.6 10*3/uL (ref 0.1–0.9)
NEUTROS ABS: 4.2 10*3/uL (ref 1.4–7.0)
NEUTROS PCT: 49 %
PLATELETS: 378 10*3/uL (ref 150–379)
RBC: 4.21 x10E6/uL (ref 3.77–5.28)
RDW: 14 % (ref 12.3–15.4)
WBC: 8.7 10*3/uL (ref 3.4–10.8)

## 2018-02-05 ENCOUNTER — Encounter: Payer: Self-pay | Admitting: Gastroenterology

## 2018-02-05 ENCOUNTER — Other Ambulatory Visit (INDEPENDENT_AMBULATORY_CARE_PROVIDER_SITE_OTHER): Payer: PPO

## 2018-02-05 ENCOUNTER — Ambulatory Visit (INDEPENDENT_AMBULATORY_CARE_PROVIDER_SITE_OTHER): Payer: PPO | Admitting: Gastroenterology

## 2018-02-05 VITALS — BP 100/62 | HR 68 | Ht 58.25 in | Wt 136.5 lb

## 2018-02-05 DIAGNOSIS — R194 Change in bowel habit: Secondary | ICD-10-CM | POA: Diagnosis not present

## 2018-02-05 DIAGNOSIS — R197 Diarrhea, unspecified: Secondary | ICD-10-CM

## 2018-02-05 LAB — IGA: IGA: 264 mg/dL (ref 68–378)

## 2018-02-05 MED ORDER — GLYCOPYRROLATE 2 MG PO TABS: 2.0000 mg | ORAL_TABLET | Freq: Two times a day (BID) | ORAL | 5 refills | Status: DC

## 2018-02-05 NOTE — Progress Notes (Signed)
History of Present Illness: This is a 32 year old female referred by Krystal Rm, NP-C for the evaluation of diarrhea.  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  She plans to travel to [place] for 2 months and is leaving in 10 days.  She relates a long-term bowel pattern of a bowel movement about every other day to every third day.  About 5 weeks ago she had a sudden change in bowel habits with 3-4 loose urgent stools with some abdominal cramping on most days.  Her symptoms generally occur in the morning.  About the same time as her symptoms started she substantially changed her diet eating healthier diet including more salads.  When she was initially seen for symptoms by her PCP her white blood cell count was modestly elevated at 11.4.  It was rechecked and normalized.  Stool culture, O & P, C diff negative. CBC, CMP unremarkable.  She states she had cut down on her intake of beans and salads for the past 2 days and her diarrhea has actually improved.  She denies recent antibiotic usage and recent travel.  No known contacts with similar symptoms. Denies weight loss, constipation, change in bowel habits.  Change in stool caliber, melena, hematochezia, nausea, vomiting, dysphagia, reflux symptoms, chest pain.    No Known Allergies Outpatient Medications Prior to Visit  Medication Sig Dispense Refill  . Multiple Vitamins-Minerals (MULTIVITAL) CHEW Chew 2 tablets by mouth daily.    . mupirocin ointment (BACTROBAN) 2 % Place 1 application into the nose 2 (two) times daily. 22 g 0  . Omega-3 Fatty Acids (FISH OIL PO) Take by mouth.     No facility-administered medications prior to visit.    Past Medical History:  Diagnosis Date  . Hyperlipemia    History reviewed. No pertinent surgical history. Social History   Socioeconomic History  . Marital status: Single    Spouse name: Not on file  . Number of children: 0  . Years of education: Not on file  . Highest education  level: Not on file  Occupational History  . Occupation: Ship broker  Social Needs  . Financial resource strain: Not on file  . Food insecurity:    Worry: Not on file    Inability: Not on file  . Transportation needs:    Medical: Not on file    Non-medical: Not on file  Tobacco Use  . Smoking status: Never Smoker  . Smokeless tobacco: Never Used  Substance and Sexual Activity  . Alcohol use: No  . Drug use: No  . Sexual activity: Never  Lifestyle  . Physical activity:    Days per week: Not on file    Minutes per session: Not on file  . Stress: Not on file  Relationships  . Social connections:    Talks on phone: Not on file    Gets together: Not on file    Attends religious service: Not on file    Active member of club or organization: Not on file    Attends meetings of clubs or organizations: Not on file    Relationship status: Not on file  Other Topics Concern  . Not on file  Social History Narrative  . Not on file   Family History  Problem Relation Age of Onset  . Diabetes Mother   . Hypertension Mother   . Diabetes Father   . Diabetes Maternal Grandmother   . Hypertension Maternal Grandmother   .  Diabetes Maternal Grandfather   . Hypertension Maternal Grandfather   . Diabetes Paternal Grandmother   . Hypertension Paternal Grandmother   . Diabetes Paternal Grandfather   . Hypertension Paternal Grandfather       Review of Systems: Pertinent positive and negative review of systems were noted in the above HPI section. All other review of systems were otherwise negative.    Physical Exam: General: Well developed, well nourished, no acute distress Head: Normocephalic and atraumatic Eyes:  sclerae anicteric, EOMI Ears: Normal auditory acuity Mouth: No deformity or lesions Neck: Supple, no masses or thyromegaly Lungs: Clear throughout to auscultation Heart: Regular rate and rhythm; no murmurs, rubs or bruits Abdomen: Soft, non tender and non distended. No  masses, hepatosplenomegaly or hernias noted. Normal Bowel sounds Rectal: Musculoskeletal: Symmetrical with no gross deformities  Skin: No lesions on visible extremities Pulses:  Normal pulses noted Extremities: No clubbing, cyanosis, edema or deformities noted Neurological: Alert oriented x 4, grossly nonfocal Cervical Nodes:  No significant cervical adenopathy Inguinal Nodes: No significant inguinal adenopathy Psychological:  Alert and cooperative. Normal mood and affect  Assessment and Recommendations:  1. Diarrhea.  Change in bowel habits.  Postinfectious diarrhea or a food intolerance is very likely. TSH, tTG, IgA today.  Glycopyrrolate 1 mg p.o. twice daily.  Trial of avoiding beans for several days and if symptoms not resolved then a trial of avoiding salads and other raw fruits/raw vegetables for several days.  Consider colonoscopy if symptoms persist.  Advised to seek follow-up care when in [place] for the next 2 months if symptoms persist.  She is advised to schedule appointment with [place] when she return to the [place] if her symptoms persist or condition requires ongoing care.   cc: Krystal Rm, NP-C [place], [place] [phone]

## 2018-02-05 NOTE — Patient Instructions (Signed)
Your provider has requested that you go to the basement level for lab work before leaving today. Press "B" on the elevator. The lab is located at the first door on the left as you exit the elevator.  We have sent the following medications to your pharmacy for you to pick up at your convenience: glycopyrrolate.   Avoid raw fruits and vegetables to see if this helps with your symptoms.   Call our office back when you return from your trip to make a follow up appointment if you see no improvement in your symptoms.   Normal BMI (Body Mass Index- based on height and weight) is between 19 and 25. Your BMI today is Body mass index is 28.28 kg/m. Marland Kitchen Please consider follow up  regarding your BMI with your Primary Care Provider.  Thank you for choosing me and [facility].  Pricilla Riffle. Dagoberto Ligas., MD., [facility] Regal

## 2018-02-06 LAB — TSH: TSH: 1.13 u[IU]/mL (ref 0.35–4.50)

## 2018-02-08 LAB — TISSUE TRANSGLUTAMINASE, IGA: (tTG) Ab, IgA: 1 U/mL

## 2018-04-27 ENCOUNTER — Ambulatory Visit (INDEPENDENT_AMBULATORY_CARE_PROVIDER_SITE_OTHER): Payer: PPO | Admitting: Medical

## 2018-04-27 VITALS — BP 100/60 | HR 85 | Temp 98.0°F | Resp 16 | Ht 59.0 in | Wt 142.2 lb

## 2018-04-27 DIAGNOSIS — R05 Cough: Secondary | ICD-10-CM

## 2018-04-27 DIAGNOSIS — J988 Other specified respiratory disorders: Secondary | ICD-10-CM

## 2018-04-27 DIAGNOSIS — R059 Cough, unspecified: Secondary | ICD-10-CM

## 2018-04-27 MED ORDER — AZITHROMYCIN 250 MG PO TABS: ORAL_TABLET | ORAL | 0 refills | Status: DC

## 2018-04-27 MED ORDER — PREDNISONE 10 MG PO TABS: 10.0000 mg | ORAL_TABLET | Freq: Every day | ORAL | 0 refills | Status: DC

## 2018-04-27 MED ORDER — BENZONATATE 200 MG PO CAPS: 200.0000 mg | ORAL_CAPSULE | Freq: Three times a day (TID) | ORAL | 0 refills | Status: DC | PRN

## 2018-04-27 NOTE — Progress Notes (Signed)
Subjective: Chief Complaint  Patient presents with  . cough    cough, X 1 week   Here for cough x 1 week.  Coughing all day and night, worse at night.  Started with flu like symptoms last week, but currently no sore throat, no pain in sinus.  No fever.  Sometimes chills.  No body aches.   Cough sometimes worse with spicy food or seafood.   Went to urgent care last week, was given cough medication, nasal spray.   Not helping.    Feels fatigued.  Back in her home country of [place] would use antibiotic and allergy pill for this.  No hx/o GERD.   Likes spicy foods.    Past Medical History:  Diagnosis Date  . Hyperlipemia    Current Outpatient Medications on File Prior to Visit  Medication Sig Dispense Refill  . Multiple Vitamins-Minerals (MULTIVITAL) CHEW Chew 2 tablets by mouth daily.    . mupirocin ointment (BACTROBAN) 2 % Place 1 application into the nose 2 (two) times daily. 22 g 0  . glycopyrrolate (ROBINUL) 2 MG tablet Take 1 tablet (2 mg total) by mouth 2 (two) times daily. (Patient not taking: Reported on 04/27/2018) 60 tablet 5   No current facility-administered medications on file prior to visit.    ROS as in subjective    Objective: BP 100/60   Pulse 85   Temp 98 F (36.7 C) (Oral)   Resp 16   Ht 4\' 11"  (1.499 m)   Wt 142 lb 3.2 oz (64.5 kg)   SpO2 100%   BMI 28.72 kg/m   General appearence: alert, no distress, WD/WN, coughing quite a bit HEENT: normocephalic, sclerae anicteric, TMs pearly, nares patent, no discharge or erythema, pharynx normal Oral cavity: MMM, no lesions Neck: supple, no lymphadenopathy, no thyromegaly, no masses Heart: RRR, normal S1, S2, no murmurs Lungs: CTA bilaterally, no wheezes, rhonchi, or rales Pulses: 2+ symmetric, upper and lower extremities, normal cap refill     Assessment: Encounter Diagnoses  Name Primary?  . Cough Yes  . Respiratory tract infection      Plan: Discusses symptoms and concerns. Discussed prior similar  symptom and treatment.  Advised good hydration, rest, and medications below.  F/u if not much improved by end of the week   Krystal Cook was seen today for cough.  Diagnoses and all orders for this visit:  Cough  Respiratory tract infection  Other orders -     benzonatate (TESSALON) 200 MG capsule; Take 1 capsule (200 mg total) by mouth 3 (three) times daily as needed for cough. -     predniSONE (DELTASONE) 10 MG tablet; Take 1 tablet (10 mg total) by mouth daily with breakfast. -     azithromycin (ZITHROMAX) 250 MG tablet; 2 tablets day 1, then 1 tablet days 2-4

## 2018-06-01 ENCOUNTER — Ambulatory Visit (INDEPENDENT_AMBULATORY_CARE_PROVIDER_SITE_OTHER): Payer: PPO | Admitting: Psychology

## 2018-06-01 DIAGNOSIS — F321 Major depressive disorder, single episode, moderate: Secondary | ICD-10-CM

## 2018-06-29 ENCOUNTER — Ambulatory Visit (INDEPENDENT_AMBULATORY_CARE_PROVIDER_SITE_OTHER): Payer: PPO | Admitting: Psychology

## 2018-06-29 DIAGNOSIS — F321 Major depressive disorder, single episode, moderate: Secondary | ICD-10-CM | POA: Diagnosis not present

## 2018-07-27 ENCOUNTER — Ambulatory Visit: Payer: PPO | Admitting: Psychology

## 2019-03-03 ENCOUNTER — Telehealth: Payer: Self-pay | Admitting: Family Medicine

## 2019-03-03 NOTE — Telephone Encounter (Signed)
Left message pt needs appt

## 2020-12-05 LAB — OB RESULTS CONSOLE ABO/RH: RH Type: POSITIVE

## 2020-12-05 LAB — OB RESULTS CONSOLE HEPATITIS B SURFACE ANTIGEN: Hepatitis B Surface Ag: NEGATIVE

## 2020-12-05 LAB — OB RESULTS CONSOLE GC/CHLAMYDIA
Chlamydia: NEGATIVE
Gonorrhea: NEGATIVE

## 2020-12-05 LAB — OB RESULTS CONSOLE RUBELLA ANTIBODY, IGM: Rubella: IMMUNE

## 2020-12-05 LAB — OB RESULTS CONSOLE HIV ANTIBODY (ROUTINE TESTING): HIV: NONREACTIVE

## 2020-12-05 LAB — OB RESULTS CONSOLE RPR: RPR: NONREACTIVE

## 2020-12-05 LAB — OB RESULTS CONSOLE ANTIBODY SCREEN: Antibody Screen: NEGATIVE

## 2021-04-03 ENCOUNTER — Ambulatory Visit: Payer: PPO | Admitting: *Deleted

## 2021-04-03 ENCOUNTER — Ambulatory Visit: Payer: PPO | Attending: Obstetrics and Gynecology

## 2021-04-03 ENCOUNTER — Other Ambulatory Visit: Payer: Self-pay | Admitting: Obstetrics & Gynecology

## 2021-04-03 ENCOUNTER — Other Ambulatory Visit: Payer: Self-pay

## 2021-04-03 ENCOUNTER — Ambulatory Visit (HOSPITAL_BASED_OUTPATIENT_CLINIC_OR_DEPARTMENT_OTHER): Payer: PPO | Admitting: Obstetrics and Gynecology

## 2021-04-03 VITALS — BP 105/53 | HR 80

## 2021-04-03 DIAGNOSIS — O09512 Supervision of elderly primigravida, second trimester: Secondary | ICD-10-CM

## 2021-04-03 DIAGNOSIS — Z363 Encounter for antenatal screening for malformations: Secondary | ICD-10-CM

## 2021-04-03 DIAGNOSIS — O365999 Maternal care for other known or suspected poor fetal growth, unspecified trimester, other fetus: Secondary | ICD-10-CM

## 2021-04-03 DIAGNOSIS — Z3A26 26 weeks gestation of pregnancy: Secondary | ICD-10-CM | POA: Insufficient documentation

## 2021-04-03 DIAGNOSIS — O365920 Maternal care for other known or suspected poor fetal growth, second trimester, not applicable or unspecified: Secondary | ICD-10-CM

## 2021-04-03 DIAGNOSIS — O289 Unspecified abnormal findings on antenatal screening of mother: Secondary | ICD-10-CM

## 2021-04-03 DIAGNOSIS — O09522 Supervision of elderly multigravida, second trimester: Secondary | ICD-10-CM

## 2021-04-03 NOTE — Progress Notes (Signed)
Maternal-Fetal Medicine   Name: Krystal Cook DOB: [birth year] MRN: [id number] Referring Provider: Linda Hedges, DO  I had the pleasure of seeing [name] [facility] at [facility]. She is G1 P0 at 26w gestation and is here for a second opinion. At your office ultrasound, fetal growth restriction was detected.  Patient reports no chronic medical conditions.  Advanced maternal age.  On cell free fetal DNA screening, the risks of fetal aneuploidies are not increased.  Ultrasound On today's ultrasound, the estimated fetal weight is at the 7th percentile and the abdominal circumference measurement is at the 13th percentile.  Amniotic fluid is normal and good fetal activity seen.  Single umbilical artery is seen.  No other markers of aneuploidies or fetal structural defects are seen. Umbilical artery Doppler, performed because of fetal growth restriction, shows normal forward diastolic flow.  I counseled the couple on the following Fetal growth restriction I explained the finding of fetal growth restriction that is difficult to differentiate from a constitutionally small fetus.  Most likely cause of fetal growth restriction is placental insufficiency.  In addition, she has single umbilical artery (see below).  Patient is from [place].  I explained ultrasound protocol of monitoring fetal growth restriction.  We will perform frequent antenatal testing as per protocol.  Single umbilical artery (SUA) SUA is seen in about 1% of fetuses. In the absence of other anomalies, the risk for aneuploidies is not increased. However, ultrasound has limitations in detecting fetal anomalies that may be missed on target at anatomical survey.  SUA can be associated with fetal growth restriction, and we recommend serial growth scans for fetal growth assessment. The association of SUA with cardiac anomalies is not conclusively proven.  I have recommended fetal echocardiography.    I discussed the  significance and limitations of cell free fetal DNA screening in detecting aneuploidies.  I informed her only amniocentesis will give a definitive result on the fetal karyotype and some genetic conditions.  Explained amniocentesis procedure and possible complications including preterm delivery (1 and 500 procedures).   Patient understands that ultrasound has limitations in detecting fetal anomalies and opted not to have amniocentesis today.  Recommendations -An appointment was made for her to return in 2 weeks for umbilical artery Doppler study. -Fetal growth assessment in 3 weeks. -We will make recommendations on frequency of ultrasound after next growth assessment.  Thank you for consultation.  If you have any questions or concerns, please contact me the [facility].  Consultation including face-to-face counseling 30 minutes.

## 2021-04-04 ENCOUNTER — Other Ambulatory Visit: Payer: Self-pay | Admitting: *Deleted

## 2021-04-04 DIAGNOSIS — O365920 Maternal care for other known or suspected poor fetal growth, second trimester, not applicable or unspecified: Secondary | ICD-10-CM

## 2021-04-16 ENCOUNTER — Telehealth: Payer: Self-pay

## 2021-04-16 NOTE — Telephone Encounter (Signed)
FETAL ECHO SCHEDULED FOR 04/26/2021@1130A  W/DR COTTON.

## 2021-04-17 ENCOUNTER — Ambulatory Visit: Payer: PPO | Admitting: *Deleted

## 2021-04-17 ENCOUNTER — Other Ambulatory Visit: Payer: Self-pay

## 2021-04-17 ENCOUNTER — Ambulatory Visit: Payer: PPO | Attending: Obstetrics and Gynecology

## 2021-04-17 ENCOUNTER — Other Ambulatory Visit: Payer: Self-pay | Admitting: Obstetrics and Gynecology

## 2021-04-17 VITALS — BP 102/57 | HR 78

## 2021-04-17 DIAGNOSIS — O09513 Supervision of elderly primigravida, third trimester: Secondary | ICD-10-CM

## 2021-04-17 DIAGNOSIS — O365920 Maternal care for other known or suspected poor fetal growth, second trimester, not applicable or unspecified: Secondary | ICD-10-CM | POA: Diagnosis not present

## 2021-04-17 DIAGNOSIS — O289 Unspecified abnormal findings on antenatal screening of mother: Secondary | ICD-10-CM | POA: Diagnosis not present

## 2021-04-17 DIAGNOSIS — O365990 Maternal care for other known or suspected poor fetal growth, unspecified trimester, not applicable or unspecified: Secondary | ICD-10-CM | POA: Insufficient documentation

## 2021-04-17 DIAGNOSIS — O365930 Maternal care for other known or suspected poor fetal growth, third trimester, not applicable or unspecified: Secondary | ICD-10-CM | POA: Insufficient documentation

## 2021-04-17 DIAGNOSIS — O283 Abnormal ultrasonic finding on antenatal screening of mother: Secondary | ICD-10-CM | POA: Diagnosis present

## 2021-04-17 DIAGNOSIS — Z3A28 28 weeks gestation of pregnancy: Secondary | ICD-10-CM

## 2021-04-18 ENCOUNTER — Other Ambulatory Visit: Payer: Self-pay | Admitting: *Deleted

## 2021-04-18 DIAGNOSIS — O365990 Maternal care for other known or suspected poor fetal growth, unspecified trimester, not applicable or unspecified: Secondary | ICD-10-CM

## 2021-04-18 NOTE — Progress Notes (Unsigned)
U

## 2021-04-24 ENCOUNTER — Encounter: Payer: Self-pay | Admitting: *Deleted

## 2021-04-24 ENCOUNTER — Ambulatory Visit (HOSPITAL_BASED_OUTPATIENT_CLINIC_OR_DEPARTMENT_OTHER): Payer: PPO

## 2021-04-24 ENCOUNTER — Other Ambulatory Visit: Payer: Self-pay

## 2021-04-24 ENCOUNTER — Ambulatory Visit: Payer: PPO | Admitting: *Deleted

## 2021-04-24 ENCOUNTER — Ambulatory Visit (HOSPITAL_BASED_OUTPATIENT_CLINIC_OR_DEPARTMENT_OTHER): Payer: PPO | Admitting: *Deleted

## 2021-04-24 VITALS — BP 101/58 | HR 90

## 2021-04-24 DIAGNOSIS — O365920 Maternal care for other known or suspected poor fetal growth, second trimester, not applicable or unspecified: Secondary | ICD-10-CM | POA: Diagnosis not present

## 2021-04-24 DIAGNOSIS — O368930 Maternal care for other specified fetal problems, third trimester, not applicable or unspecified: Secondary | ICD-10-CM | POA: Diagnosis not present

## 2021-04-24 DIAGNOSIS — Q270 Congenital absence and hypoplasia of umbilical artery: Secondary | ICD-10-CM | POA: Diagnosis not present

## 2021-04-24 DIAGNOSIS — O09513 Supervision of elderly primigravida, third trimester: Secondary | ICD-10-CM | POA: Diagnosis not present

## 2021-04-24 DIAGNOSIS — Z3A29 29 weeks gestation of pregnancy: Secondary | ICD-10-CM | POA: Diagnosis not present

## 2021-04-24 DIAGNOSIS — O365930 Maternal care for other known or suspected poor fetal growth, third trimester, not applicable or unspecified: Secondary | ICD-10-CM

## 2021-04-24 DIAGNOSIS — O365990 Maternal care for other known or suspected poor fetal growth, unspecified trimester, not applicable or unspecified: Secondary | ICD-10-CM

## 2021-04-24 NOTE — Procedures (Signed)
Krystal Cook 12-Mar-1986 [redacted]w[redacted]d  Fetus A Non-Stress Test Interpretation for 04/24/21  Indication: IUGR  Fetal Heart Rate A Mode: External Baseline Rate (A): 140 bpm Variability: Moderate Accelerations: 10 x 10 Decelerations: Variable Multiple birth?: No  Uterine Activity Mode: Palpation, Toco Contraction Frequency (min): none Resting Tone Palpated: Relaxed Resting Time: Adequate  Interpretation (Fetal Testing) Nonstress Test Interpretation: Reactive Overall Impression: Reassuring for gestational age Comments: Dr. Gertie Exon reviewed tracing

## 2021-04-29 ENCOUNTER — Other Ambulatory Visit: Payer: Self-pay | Admitting: *Deleted

## 2021-04-29 DIAGNOSIS — O365990 Maternal care for other known or suspected poor fetal growth, unspecified trimester, not applicable or unspecified: Secondary | ICD-10-CM

## 2021-05-02 ENCOUNTER — Ambulatory Visit: Payer: PPO | Admitting: *Deleted

## 2021-05-02 ENCOUNTER — Encounter: Payer: Self-pay | Admitting: *Deleted

## 2021-05-02 ENCOUNTER — Other Ambulatory Visit: Payer: Self-pay | Admitting: Obstetrics and Gynecology

## 2021-05-02 ENCOUNTER — Ambulatory Visit: Payer: PPO | Attending: Obstetrics and Gynecology

## 2021-05-02 ENCOUNTER — Ambulatory Visit (HOSPITAL_BASED_OUTPATIENT_CLINIC_OR_DEPARTMENT_OTHER): Payer: PPO | Admitting: *Deleted

## 2021-05-02 ENCOUNTER — Other Ambulatory Visit: Payer: Self-pay

## 2021-05-02 VITALS — BP 101/56 | HR 81

## 2021-05-02 DIAGNOSIS — O365930 Maternal care for other known or suspected poor fetal growth, third trimester, not applicable or unspecified: Secondary | ICD-10-CM

## 2021-05-02 DIAGNOSIS — O09523 Supervision of elderly multigravida, third trimester: Secondary | ICD-10-CM

## 2021-05-02 DIAGNOSIS — O368930 Maternal care for other specified fetal problems, third trimester, not applicable or unspecified: Secondary | ICD-10-CM

## 2021-05-02 DIAGNOSIS — O365990 Maternal care for other known or suspected poor fetal growth, unspecified trimester, not applicable or unspecified: Secondary | ICD-10-CM

## 2021-05-02 DIAGNOSIS — Z3A30 30 weeks gestation of pregnancy: Secondary | ICD-10-CM

## 2021-05-02 DIAGNOSIS — Q270 Congenital absence and hypoplasia of umbilical artery: Secondary | ICD-10-CM

## 2021-05-02 NOTE — Procedures (Signed)
Lorma Smart 1986/08/01 [redacted]w[redacted]d Fetus A Non-Stress Test Interpretation for 05/02/21  Indication: IUGR  Fetal Heart Rate A Mode: External Baseline Rate (A): 145 bpm Variability: Moderate Accelerations: 15 x 15 Decelerations: None Multiple birth?: No  Uterine Activity Mode: Palpation, Toco Contraction Frequency (min): none Resting Tone Palpated: Relaxed Resting Time: Adequate  Interpretation (Fetal Testing) Nonstress Test Interpretation: Reactive Overall Impression: Reassuring for gestational age Comments: Dr. FAnnamaria Bootsreviewed tracing

## 2021-05-09 ENCOUNTER — Ambulatory Visit: Payer: PPO

## 2021-05-09 ENCOUNTER — Encounter: Payer: Self-pay | Admitting: *Deleted

## 2021-05-09 ENCOUNTER — Other Ambulatory Visit: Payer: Self-pay

## 2021-05-09 ENCOUNTER — Ambulatory Visit: Payer: PPO | Attending: Maternal & Fetal Medicine

## 2021-05-09 ENCOUNTER — Ambulatory Visit: Payer: PPO | Admitting: *Deleted

## 2021-05-09 ENCOUNTER — Ambulatory Visit (HOSPITAL_BASED_OUTPATIENT_CLINIC_OR_DEPARTMENT_OTHER): Payer: PPO | Admitting: *Deleted

## 2021-05-09 VITALS — BP 93/50 | HR 87

## 2021-05-09 DIAGNOSIS — O365990 Maternal care for other known or suspected poor fetal growth, unspecified trimester, not applicable or unspecified: Secondary | ICD-10-CM | POA: Diagnosis present

## 2021-05-09 DIAGNOSIS — O09523 Supervision of elderly multigravida, third trimester: Secondary | ICD-10-CM

## 2021-05-09 DIAGNOSIS — O365930 Maternal care for other known or suspected poor fetal growth, third trimester, not applicable or unspecified: Secondary | ICD-10-CM | POA: Insufficient documentation

## 2021-05-09 DIAGNOSIS — O368930 Maternal care for other specified fetal problems, third trimester, not applicable or unspecified: Secondary | ICD-10-CM | POA: Diagnosis not present

## 2021-05-09 DIAGNOSIS — Q270 Congenital absence and hypoplasia of umbilical artery: Secondary | ICD-10-CM | POA: Diagnosis not present

## 2021-05-09 DIAGNOSIS — Z3A31 31 weeks gestation of pregnancy: Secondary | ICD-10-CM | POA: Diagnosis not present

## 2021-05-09 NOTE — Procedures (Signed)
Krystal Cook 06-08-1986 [redacted]w[redacted]d Fetus A Non-Stress Test Interpretation for 05/09/21  Indication: IUGR  Fetal Heart Rate A Mode: External Baseline Rate (A): 140 bpm Variability: Moderate Accelerations: 10 x 10 Decelerations: None Multiple birth?: No  Uterine Activity Mode: Palpation, Toco Contraction Frequency (min): none Resting Tone Palpated: Relaxed Resting Time: Adequate  Interpretation (Fetal Testing) Nonstress Test Interpretation: Reactive Overall Impression: Reassuring for gestational age Comments: Dr. SDonalee Citrinreviewed tracing

## 2021-05-16 ENCOUNTER — Ambulatory Visit: Payer: PPO | Admitting: *Deleted

## 2021-05-16 ENCOUNTER — Other Ambulatory Visit: Payer: Self-pay

## 2021-05-16 ENCOUNTER — Ambulatory Visit: Payer: PPO | Attending: Maternal & Fetal Medicine

## 2021-05-16 ENCOUNTER — Other Ambulatory Visit: Payer: Self-pay | Admitting: *Deleted

## 2021-05-16 ENCOUNTER — Encounter: Payer: Self-pay | Admitting: *Deleted

## 2021-05-16 VITALS — BP 106/57 | HR 92

## 2021-05-16 DIAGNOSIS — O365930 Maternal care for other known or suspected poor fetal growth, third trimester, not applicable or unspecified: Secondary | ICD-10-CM | POA: Diagnosis not present

## 2021-05-16 DIAGNOSIS — Q270 Congenital absence and hypoplasia of umbilical artery: Secondary | ICD-10-CM | POA: Diagnosis not present

## 2021-05-16 DIAGNOSIS — O368930 Maternal care for other specified fetal problems, third trimester, not applicable or unspecified: Secondary | ICD-10-CM | POA: Diagnosis not present

## 2021-05-16 DIAGNOSIS — Z3A32 32 weeks gestation of pregnancy: Secondary | ICD-10-CM

## 2021-05-16 DIAGNOSIS — Z362 Encounter for other antenatal screening follow-up: Secondary | ICD-10-CM | POA: Diagnosis not present

## 2021-05-16 DIAGNOSIS — O365990 Maternal care for other known or suspected poor fetal growth, unspecified trimester, not applicable or unspecified: Secondary | ICD-10-CM

## 2021-05-16 DIAGNOSIS — O09523 Supervision of elderly multigravida, third trimester: Secondary | ICD-10-CM

## 2021-05-16 DIAGNOSIS — O09513 Supervision of elderly primigravida, third trimester: Secondary | ICD-10-CM

## 2021-05-16 DIAGNOSIS — O09899 Supervision of other high risk pregnancies, unspecified trimester: Secondary | ICD-10-CM

## 2021-05-23 ENCOUNTER — Ambulatory Visit: Payer: PPO

## 2021-05-25 ENCOUNTER — Other Ambulatory Visit: Payer: Self-pay

## 2021-05-25 ENCOUNTER — Inpatient Hospital Stay (HOSPITAL_COMMUNITY)
Admission: AD | Admit: 2021-05-25 | Discharge: 2021-05-25 | Disposition: A | Discharge status: Home / Self Care | Payer: PPO | Attending: Obstetrics & Gynecology | Admitting: Obstetrics & Gynecology

## 2021-05-25 DIAGNOSIS — Z3A33 33 weeks gestation of pregnancy: Secondary | ICD-10-CM | POA: Insufficient documentation

## 2021-05-25 DIAGNOSIS — O26893 Other specified pregnancy related conditions, third trimester: Secondary | ICD-10-CM | POA: Diagnosis not present

## 2021-05-25 DIAGNOSIS — Z7952 Long term (current) use of systemic steroids: Secondary | ICD-10-CM | POA: Diagnosis not present

## 2021-05-25 DIAGNOSIS — O4702 False labor before 37 completed weeks of gestation, second trimester: Secondary | ICD-10-CM | POA: Diagnosis present

## 2021-05-25 DIAGNOSIS — M545 Low back pain, unspecified: Secondary | ICD-10-CM | POA: Insufficient documentation

## 2021-05-25 DIAGNOSIS — O09513 Supervision of elderly primigravida, third trimester: Secondary | ICD-10-CM | POA: Diagnosis not present

## 2021-05-25 DIAGNOSIS — Z3689 Encounter for other specified antenatal screening: Secondary | ICD-10-CM

## 2021-05-25 DIAGNOSIS — O4700 False labor before 37 completed weeks of gestation, unspecified trimester: Secondary | ICD-10-CM

## 2021-05-25 LAB — URINALYSIS, ROUTINE W REFLEX MICROSCOPIC
Bilirubin Urine: NEGATIVE
Glucose, UA: NEGATIVE mg/dL
Hgb urine dipstick: NEGATIVE
Ketones, ur: NEGATIVE mg/dL
Nitrite: NEGATIVE
Protein, ur: NEGATIVE mg/dL
Specific Gravity, Urine: 1.006 (ref 1.005–1.030)
pH: 6 (ref 5.0–8.0)

## 2021-05-25 LAB — WET PREP, GENITAL
Clue Cells Wet Prep HPF POC: NONE SEEN
Sperm: NONE SEEN
Trich, Wet Prep: NONE SEEN
Yeast Wet Prep HPF POC: NONE SEEN

## 2021-05-25 MED ORDER — CYCLOBENZAPRINE HCL 5 MG PO TABS
10.0000 mg | ORAL_TABLET | Freq: Once | ORAL | Status: AC
  Administered 2021-05-25: 10 mg via ORAL
  Filled 2021-05-25: qty 2

## 2021-05-25 MED ORDER — LACTATED RINGERS IV BOLUS
1000.0000 mL | Freq: Once | INTRAVENOUS | Status: AC
  Administered 2021-05-25: 1000 mL via INTRAVENOUS

## 2021-05-25 MED ORDER — NIFEDIPINE 10 MG PO CAPS
10.0000 mg | ORAL_CAPSULE | ORAL | Status: DC | PRN
  Filled 2021-05-25: qty 1

## 2021-05-25 MED ORDER — CYCLOBENZAPRINE HCL 10 MG PO TABS: 10.0000 mg | ORAL_TABLET | Freq: Two times a day (BID) | ORAL | 0 refills | Status: AC | PRN

## 2021-05-25 MED ORDER — TERBUTALINE SULFATE 1 MG/ML IJ SOLN
0.2500 mg | Freq: Once | INTRAMUSCULAR | Status: AC
  Administered 2021-05-25: 0.25 mg via SUBCUTANEOUS
  Filled 2021-05-25: qty 1

## 2021-05-25 MED ORDER — CYCLOBENZAPRINE HCL 10 MG PO TABS: 10.0000 mg | ORAL_TABLET | Freq: Two times a day (BID) | ORAL | 0 refills | Status: DC | PRN

## 2021-05-25 NOTE — MAU Provider Note (Signed)
History     CSN: [id number]  Arrival date and time: 05/25/21 0207   Event Date/Time   First Provider Initiated Contact with Patient 05/25/21 [phone]      Chief Complaint  Patient presents with   Back Pain   HPI Krystal Cook is a 35 y.o. G1P0 at 28w3dwho presents to MAU with chief complaint of low back pain. This is a new problem, onset three days ago and worsening significantly around midnight tonight. On arrival to MAU patient endorses pain score 10/10 across her lower back. She has attempted management with Tylenol, most recently taken about three hours ago. Tylenol did not provide relief. She denies trauma, fall, dysuria, fever or recent illness. She also denies lower abdominal pain, leaking of fluid, DFM.  Patient receives care with [facility].  OB History     Gravida  1   Para      Term      Preterm      AB      Living         SAB      IAB      Ectopic      Multiple      Live Births              Past Medical History:  Diagnosis Date   Hyperlipemia     Past Surgical History:  Procedure Laterality Date   NO PAST SURGERIES      Family History  Problem Relation Age of Onset   Diabetes Mother    Hypertension Mother    Diabetes Father    Diabetes Maternal Grandmother    Hypertension Maternal Grandmother    Diabetes Maternal Grandfather    Hypertension Maternal Grandfather    Diabetes Paternal Grandmother    Hypertension Paternal Grandmother    Diabetes Paternal Grandfather    Hypertension Paternal Grandfather     Social History   Tobacco Use   Smoking status: Never   Smokeless tobacco: Never  Vaping Use   Vaping Use: Never used  Substance Use Topics   Alcohol use: No   Drug use: No    Allergies:  Allergies  Allergen Reactions   Avocado Anaphylaxis and Nausea And Vomiting   Kiwi Extract Anaphylaxis   Pineapple Anaphylaxis    Medications Prior to Admission  Medication Sig Dispense Refill Last Dose   ferrous sulfate 325 (65 FE) MG  tablet Take 325 mg by mouth daily with breakfast.   05/25/2021   Multiple Vitamins-Minerals (MULTIVITAL) CHEW Chew 2 tablets by mouth daily.   05/25/2021   azithromycin (ZITHROMAX) 250 MG tablet 2 tablets day 1, then 1 tablet days 2-4 (Patient not taking: Reported on 04/03/2021) 6 tablet 0    benzonatate (TESSALON) 200 MG capsule Take 1 capsule (200 mg total) by mouth 3 (three) times daily as needed for cough. (Patient not taking: Reported on 04/03/2021) 30 capsule 0    glycopyrrolate (ROBINUL) 2 MG tablet Take 1 tablet (2 mg total) by mouth 2 (two) times daily. (Patient not taking: Reported on 04/27/2018) 60 tablet 5    mupirocin ointment (BACTROBAN) 2 % Place 1 application into the nose 2 (two) times daily. (Patient not taking: Reported on 04/03/2021) 22 g 0    predniSONE (DELTASONE) 10 MG tablet Take 1 tablet (10 mg total) by mouth daily with breakfast. (Patient not taking: Reported on 04/03/2021) 21 tablet 0     Review of Systems  Musculoskeletal:  Positive for back pain.  All other systems  reviewed and are negative. Physical Exam   Blood pressure 105/65, pulse 88, temperature 98 F (36.7 C), temperature source Oral, resp. rate 20, height '4\' 11"'$  (1.499 m), weight 73.5 kg, last menstrual period 10/03/2020, SpO2 100 %.  Physical Exam Vitals and nursing note reviewed. Exam conducted with a chaperone present.  Constitutional:      Appearance: Normal appearance.  Cardiovascular:     Rate and Rhythm: Normal rate.     Pulses: Normal pulses.     Heart sounds: Normal heart sounds.  Pulmonary:     Effort: Pulmonary effort is normal.     Breath sounds: Normal breath sounds.  Abdominal:     Comments: Gravid  Genitourinary:    Comments: Pelvic exam: External genitalia normal, vaginal walls pink and well rugated, cervix visually closed, no lesions noted. FFN collected, closed cervix confirmed with digital exam, FFN discarded.   Skin:    Capillary Refill: Capillary refill takes less than 2 seconds.   Neurological:     Mental Status: She is alert and oriented to person, place, and time.  Psychiatric:        Mood and Affect: Mood normal.        Behavior: Behavior normal.        Thought Content: Thought content normal.        Judgment: Judgment normal.    MAU Course  Procedures  --Language barrier: iPad interpreter utilized to sign consent for partner to serve as interpreter --Procardia held for low baseline BP -- 0345: notified by RN that Pt and FOB decline Terbutaline. Would like to trial iv fluid bolus and Flexeril first -- 0435: CNM returned to bedside. Discussion of concern for recurrent painful contractions. Merits treatment even though cervix is closed. Pt now agreeable to Terbutaline --Patient resting comfortably s/o Terbutaline --Reactive tracing: baseline 145, mod var, + accels, no decels --Toco: initially ctx 1 3 min, then resolved with treatments administered in MAU --Cervix remains closed/thick/posterior 3 hours after initial assessment --Patient electively using wheelchair due to fatigue and pelvic discomfort. Discussed importance of keeping muscles loose with gentle movements e.g. walking and gentle stretching  Patient Vitals for the past 24 hrs:  BP Temp Temp src Pulse Resp SpO2 Height Weight  05/25/21 0630 100/62 98 F (36.7 C) Oral 95 14 99 % -- --  05/25/21 0220 105/65 98 F (36.7 C) Oral 88 20 100 % '4\' 11"'$  (1.499 m) 73.5 kg   Recent Results (from the past 2160 hour(s))  Urinalysis, Routine w reflex microscopic Urine, Clean Catch     Status: Abnormal   Collection Time: 05/25/21  2:26 AM  Result Value Ref Range   Color, Urine YELLOW YELLOW   APPearance HAZY (A) CLEAR   Specific Gravity, Urine 1.006 1.005 - 1.030   pH 6.0 5.0 - 8.0   Glucose, UA NEGATIVE NEGATIVE mg/dL   Hgb urine dipstick NEGATIVE NEGATIVE   Bilirubin Urine NEGATIVE NEGATIVE   Ketones, ur NEGATIVE NEGATIVE mg/dL   Protein, ur NEGATIVE NEGATIVE mg/dL   Nitrite NEGATIVE NEGATIVE    Leukocytes,Ua TRACE (A) NEGATIVE   RBC / HPF 0-5 0 - 5 RBC/hpf   WBC, UA 6-10 0 - 5 WBC/hpf   Bacteria, UA RARE (A) NONE SEEN   Squamous Epithelial / LPF 11-20 0 - 5    Comment: Performed at [facility], [address], [place], [place] [address]  Wet prep, genital     Status: Abnormal   Collection Time: 05/25/21  3:11 AM  Specimen: Vaginal  Result Value Ref Range   Yeast Wet Prep HPF POC NONE SEEN NONE SEEN   Trich, Wet Prep NONE SEEN NONE SEEN   Clue Cells Wet Prep HPF POC NONE SEEN NONE SEEN   WBC, Wet Prep HPF POC MANY (A) NONE SEEN   Sperm NONE SEEN     Comment: Performed at [facility], [place] [address] [place], [place] [address]   Meds ordered this encounter  Medications   lactated ringers bolus 1,000 mL   DISCONTD: NIFEdipine (PROCARDIA) capsule 10 mg   cyclobenzaprine (FLEXERIL) tablet 10 mg   terbutaline (BRETHINE) injection 0.25 mg   cyclobenzaprine (FLEXERIL) 10 MG tablet    Sig: Take 1 tablet (10 mg total) by mouth 2 (two) times daily as needed for muscle spasms.    Dispense:  60 tablet    Refill:  0    Order Specific Question:   Supervising Provider    Answer:   Janyth Pupa [id number]   Assessment and Plan  --35 y.o. G1P0 at [redacted]w[redacted]d --Reactive tracing --Closed cervix --Low back pain score improved from 10/10 to 5 /10 --Discharge home in stable condition  SDarlina Rumpf CNM 05/25/2021, 7:13 AM

## 2021-05-25 NOTE — MAU Note (Signed)
Pt having severe back pain. Started 3 days ago but worse tonight. Took tylenol with no help. No bleeding or LOF. +FM

## 2021-05-26 LAB — CULTURE, OB URINE: Culture: >=100000 — AB

## 2021-05-27 LAB — GC/CHLAMYDIA PROBE AMP (~~LOC~~) NOT AT ARMC
Chlamydia: NEGATIVE
Comment: NEGATIVE
Comment: NORMAL
Neisseria Gonorrhea: NEGATIVE

## 2021-05-28 ENCOUNTER — Other Ambulatory Visit: Payer: Self-pay | Admitting: *Deleted

## 2021-06-06 ENCOUNTER — Ambulatory Visit: Payer: PPO | Attending: Obstetrics and Gynecology

## 2021-06-06 ENCOUNTER — Encounter: Payer: Self-pay | Admitting: *Deleted

## 2021-06-06 ENCOUNTER — Ambulatory Visit: Payer: PPO | Admitting: *Deleted

## 2021-06-06 ENCOUNTER — Other Ambulatory Visit: Payer: Self-pay

## 2021-06-06 VITALS — BP 102/58 | HR 91

## 2021-06-06 DIAGNOSIS — Z3A35 35 weeks gestation of pregnancy: Secondary | ICD-10-CM | POA: Diagnosis not present

## 2021-06-06 DIAGNOSIS — O09899 Supervision of other high risk pregnancies, unspecified trimester: Secondary | ICD-10-CM | POA: Diagnosis present

## 2021-06-06 DIAGNOSIS — O365930 Maternal care for other known or suspected poor fetal growth, third trimester, not applicable or unspecified: Secondary | ICD-10-CM

## 2021-06-06 DIAGNOSIS — O09523 Supervision of elderly multigravida, third trimester: Secondary | ICD-10-CM | POA: Diagnosis not present

## 2021-06-06 DIAGNOSIS — O09513 Supervision of elderly primigravida, third trimester: Secondary | ICD-10-CM | POA: Insufficient documentation

## 2021-06-06 DIAGNOSIS — O09893 Supervision of other high risk pregnancies, third trimester: Secondary | ICD-10-CM | POA: Diagnosis not present

## 2021-06-07 ENCOUNTER — Other Ambulatory Visit: Payer: Self-pay | Admitting: *Deleted

## 2021-06-07 DIAGNOSIS — O09899 Supervision of other high risk pregnancies, unspecified trimester: Secondary | ICD-10-CM

## 2021-06-13 LAB — OB RESULTS CONSOLE GBS: GBS: NEGATIVE

## 2021-06-27 ENCOUNTER — Other Ambulatory Visit: Payer: Self-pay

## 2021-06-27 ENCOUNTER — Ambulatory Visit: Payer: PPO | Admitting: *Deleted

## 2021-06-27 ENCOUNTER — Encounter: Payer: Self-pay | Admitting: *Deleted

## 2021-06-27 ENCOUNTER — Ambulatory Visit: Payer: PPO | Attending: Obstetrics and Gynecology

## 2021-06-27 VITALS — BP 111/64 | HR 92

## 2021-06-27 DIAGNOSIS — O09899 Supervision of other high risk pregnancies, unspecified trimester: Secondary | ICD-10-CM

## 2021-06-27 DIAGNOSIS — O365930 Maternal care for other known or suspected poor fetal growth, third trimester, not applicable or unspecified: Secondary | ICD-10-CM

## 2021-06-27 DIAGNOSIS — O09893 Supervision of other high risk pregnancies, third trimester: Secondary | ICD-10-CM

## 2021-06-27 DIAGNOSIS — Z3A38 38 weeks gestation of pregnancy: Secondary | ICD-10-CM | POA: Diagnosis not present

## 2021-06-27 DIAGNOSIS — O09523 Supervision of elderly multigravida, third trimester: Secondary | ICD-10-CM

## 2021-07-05 ENCOUNTER — Other Ambulatory Visit: Payer: Self-pay | Admitting: Obstetrics & Gynecology

## 2021-07-05 ENCOUNTER — Telehealth (HOSPITAL_COMMUNITY): Payer: Self-pay | Admitting: *Deleted

## 2021-07-05 NOTE — Telephone Encounter (Signed)
Preadmission screen  

## 2021-07-08 ENCOUNTER — Telehealth (HOSPITAL_COMMUNITY): Payer: Self-pay | Admitting: *Deleted

## 2021-07-08 NOTE — Telephone Encounter (Signed)
Preadmission screen  

## 2021-07-10 ENCOUNTER — Other Ambulatory Visit: Payer: Self-pay

## 2021-07-10 ENCOUNTER — Inpatient Hospital Stay (HOSPITAL_COMMUNITY): Payer: PPO

## 2021-07-10 ENCOUNTER — Encounter (HOSPITAL_COMMUNITY): Payer: Self-pay | Admitting: Obstetrics & Gynecology

## 2021-07-10 ENCOUNTER — Inpatient Hospital Stay (HOSPITAL_COMMUNITY)
Admission: AD | Admit: 2021-07-10 | Discharge: 2021-07-14 | DRG: 786 | Disposition: A | Discharge status: Home / Self Care | Payer: PPO | Attending: Obstetrics & Gynecology | Admitting: Obstetrics & Gynecology

## 2021-07-10 DIAGNOSIS — O365930 Maternal care for other known or suspected poor fetal growth, third trimester, not applicable or unspecified: Secondary | ICD-10-CM | POA: Diagnosis present

## 2021-07-10 DIAGNOSIS — O411230 Chorioamnionitis, third trimester, not applicable or unspecified: Secondary | ICD-10-CM | POA: Diagnosis present

## 2021-07-10 DIAGNOSIS — Z20822 Contact with and (suspected) exposure to covid-19: Secondary | ICD-10-CM | POA: Diagnosis present

## 2021-07-10 DIAGNOSIS — Q270 Congenital absence and hypoplasia of umbilical artery: Secondary | ICD-10-CM

## 2021-07-10 DIAGNOSIS — Z3A40 40 weeks gestation of pregnancy: Secondary | ICD-10-CM | POA: Diagnosis not present

## 2021-07-10 DIAGNOSIS — Z98891 History of uterine scar from previous surgery: Secondary | ICD-10-CM

## 2021-07-10 DIAGNOSIS — O09529 Supervision of elderly multigravida, unspecified trimester: Secondary | ICD-10-CM

## 2021-07-10 LAB — CBC
HCT: 34.6 % — ABNORMAL LOW (ref 36.0–46.0)
Hemoglobin: 12.2 g/dL (ref 12.0–15.0)
MCH: 31.2 pg (ref 26.0–34.0)
MCHC: 35.3 g/dL (ref 30.0–36.0)
MCV: 88.5 fL (ref 80.0–100.0)
Platelets: 341 10*3/uL (ref 150–400)
RBC: 3.91 MIL/uL (ref 3.87–5.11)
RDW: 14 % (ref 11.5–15.5)
WBC: 8.6 10*3/uL (ref 4.0–10.5)
nRBC: 0 % (ref 0.0–0.2)

## 2021-07-10 LAB — TYPE AND SCREEN
ABO/RH(D): A POS
Antibody Screen: NEGATIVE

## 2021-07-10 LAB — RESP PANEL BY RT-PCR (FLU A&B, COVID) ARPGX2
Influenza A by PCR: NEGATIVE
Influenza B by PCR: NEGATIVE
SARS Coronavirus 2 by RT PCR: NEGATIVE

## 2021-07-10 MED ORDER — LIDOCAINE HCL (PF) 1 % IJ SOLN: 30.0000 mL | INTRAMUSCULAR | Status: DC | PRN

## 2021-07-10 MED ORDER — ONDANSETRON HCL 4 MG/2ML IJ SOLN: 4.0000 mg | Freq: Four times a day (QID) | INTRAMUSCULAR | Status: DC | PRN

## 2021-07-10 MED ORDER — OXYTOCIN-SODIUM CHLORIDE 30-0.9 UT/500ML-% IV SOLN: 2.5000 [IU]/h | INTRAVENOUS | Status: DC

## 2021-07-10 MED ORDER — OXYTOCIN BOLUS FROM INFUSION: 333.0000 mL | Freq: Once | INTRAVENOUS | Status: DC

## 2021-07-10 MED ORDER — OXYTOCIN-SODIUM CHLORIDE 30-0.9 UT/500ML-% IV SOLN
1.0000 m[IU]/min | INTRAVENOUS | Status: DC
  Administered 2021-07-10 – 2021-07-11 (×2): 2 m[IU]/min via INTRAVENOUS
  Filled 2021-07-10: qty 500

## 2021-07-10 MED ORDER — MISOPROSTOL 50MCG HALF TABLET
50.0000 ug | ORAL_TABLET | ORAL | Status: DC | PRN
  Administered 2021-07-10 (×2): 50 ug via BUCCAL
  Filled 2021-07-10 (×3): qty 1

## 2021-07-10 MED ORDER — TERBUTALINE SULFATE 1 MG/ML IJ SOLN
0.2500 mg | Freq: Once | INTRAMUSCULAR | Status: DC | PRN
Start: 2021-07-10 — End: 2021-07-12

## 2021-07-10 MED ORDER — SOD CITRATE-CITRIC ACID 500-334 MG/5ML PO SOLN
30.0000 mL | ORAL | Status: DC | PRN
  Filled 2021-07-10: qty 30

## 2021-07-10 MED ORDER — ACETAMINOPHEN 325 MG PO TABS: 650.0000 mg | ORAL_TABLET | ORAL | Status: DC | PRN

## 2021-07-10 MED ORDER — LACTATED RINGERS IV SOLN: 500.0000 mL | INTRAVENOUS | Status: DC | PRN

## 2021-07-10 MED ORDER — OXYCODONE-ACETAMINOPHEN 5-325 MG PO TABS: 1.0000 | ORAL_TABLET | ORAL | Status: DC | PRN

## 2021-07-10 MED ORDER — SOD CITRATE-CITRIC ACID 500-334 MG/5ML PO SOLN: 30.0000 mL | ORAL | Status: DC | PRN

## 2021-07-10 MED ORDER — OXYTOCIN-SODIUM CHLORIDE 30-0.9 UT/500ML-% IV SOLN: 1.0000 m[IU]/min | INTRAVENOUS | Status: DC

## 2021-07-10 MED ORDER — LACTATED RINGERS IV SOLN
500.0000 mL | INTRAVENOUS | Status: DC | PRN
  Administered 2021-07-11 (×2): 500 mL via INTRAVENOUS
  Administered 2021-07-11: 250 mL via INTRAVENOUS

## 2021-07-10 MED ORDER — TERBUTALINE SULFATE 1 MG/ML IJ SOLN: 0.2500 mg | Freq: Once | INTRAMUSCULAR | Status: DC | PRN

## 2021-07-10 MED ORDER — LACTATED RINGERS IV SOLN: INTRAVENOUS | Status: DC

## 2021-07-10 MED ORDER — OXYCODONE-ACETAMINOPHEN 5-325 MG PO TABS
2.0000 | ORAL_TABLET | ORAL | Status: DC | PRN
Start: 2021-07-10 — End: 2021-07-12

## 2021-07-10 NOTE — H&P (Addendum)
MD HISTORY AND PHYSICAL  Krystal Cook is a 35 y.o. female G1P0 SIUP at 43 weeks presents for scheduled induction of labor for 2 vessel umbilical cord (single umbilical artery), history of IUGR, now small for gestational age (EFW 16%). [facility] 07/10/21 based on LMP and confirmed by early [place].  Patient w/o complaints. No ctx, no leaking of fluid, no vaginal bleeding, reports good fetal movement  Prenatal care at [place] (transfer from [facility]) complicated by: AMA - NIPS low risk female anatomy normal Vitamin D deficiency - on supplementation IUGR - now resolved Anatomy US 25 6/7 weeks on 04/04/21--EFW < 3%ile  SGA  OB History     Gravida  1   Para      Term      Preterm      AB      Living         SAB      IAB      Ectopic      Multiple      Live Births             Past Medical History:  Diagnosis Date   Hyperlipemia    Past Surgical History:  Procedure Laterality Date   NO PAST SURGERIES     Family History: family history includes Diabetes in her father, maternal grandfather, maternal grandmother, mother, paternal grandfather, and paternal grandmother; Hypertension in her maternal grandfather, maternal grandmother, mother, paternal grandfather, and paternal grandmother. Social History:  reports that she has never smoked. She has never used smokeless tobacco. She reports that she does not drink alcohol and does not use drugs.     Maternal Diabetes: No Genetic Screening: Normal Maternal Ultrasounds/Referrals: IUGR now efw  16% Fetal Ultrasounds or other Referrals:  Referred to Materal Fetal Medicine  Maternal Substance Abuse:  No Significant Maternal Medications:  Meds include: Other: cyclobenzaprine, prenatal vitamins, ferrous sulfate, vitamin D Significant Maternal Lab Results:  Group B Strep negative Other Comments:  None  Review of Systems as per HPI  Physical Exam Vitals and nursing note reviewed.  Constitutional:      Appearance: Normal appearance.   Genitourinary:    General: Normal vulva.  Neurological:     Mental Status: She is alert.    Blood pressure 107/76, pulse 79, temperature 98.4 F (36.9 C), temperature source Oral, height 4\' 11"  (1.499 m), weight 75.1 kg, last menstrual period 10/03/2020.  General Dilation: 1 Effacement (%): 70 Station: Ballotable Exam by:: Dr. Alwyn Pea  Fetal Heart Tracing: Baseline 120's moderate variability, accelerations no decelerations. Toco: irregular mild ctx.  Prenatal labs: ABO, Rh: A POS Antibody:  NEG Rubella: Immune (03/02 0000) RPR: Nonreactive (03/02 0000)  HBsAg: Negative (03/02 0000)  HIV: Non-reactive (03/02 0000)  GBS: Negative/-- (09/08 0000)   Last Ultrasound OBSTETRICS REPORT                       (Signed Final 06/27/2021 04:05 pm) ---------------------------------------------------------------------- Patient Info  ID #:       [id number]                          D.O.B.:  February 01, 1986 (35 yrs)  Name:       Krystal Cook                   Visit Date: 06/27/2021 03:18 pm ---------------------------------------------------------------------- Performed By  Attending:        Johnell Comings MD  Ref. Address:     [address]  Performed By:     Lelan Pons RDMS       [facility]                                                             [facility]  Referred By:      Linda Hedges                    DO ---------------------------------------------------------------------- Orders  #  Description                           Code        Ordered By  1  [place] MFM OB FOLLOW UP                   [phone]    [facility] ----------------------------------------------------------------------  #  Order #                     Accession #                 Episode #  1  [id number]                   [id number]                 [id number] ---------------------------------------------------------------------- Indications  Maternal care for known or suspected poor      O36.5930  fetal growth, third trimester, not applicable or  unspecified IUGR  [redacted] weeks gestation of pregnancy                Z3A.38  Abnormal fetal ultrasound (2 vessel umbilical  T59.7  cord)  LOW risk NIPS (Normal ECHO)  Advanced maternal age multigravida 45+,        O71.523  third trimester (30 yrs) ---------------------------------------------------------------------- Fetal Evaluation  Num Of Fetuses:         1  Fetal Heart Rate(bpm):  130  Cardiac Activity:       Observed  Presentation:  Cephalic  Placenta:               Posterior  P. Cord Insertion:      Previously Visualized  Amniotic Fluid  AFI FV:      Within normal limits  AFI Sum(cm)     %Tile       Largest Pocket(cm)  16.23           63          4.54  RUQ(cm)       RLQ(cm)       LUQ(cm)        LLQ(cm)  3.36          3.79          4.54           4.54 ---------------------------------------------------------------------- Biometry  BPD:      92.1  mm     G. Age:  37w 3d         55  %    CI:        79.19   %    70 - 86                                                          FL/HC:      21.2   %    20.9 - 22.7  HC:      327.2  mm     G. Age:  37w 1d         12  %    HC/AC:      1.02        0.92 - 1.05  AC:      319.7  mm     G. Age:  35w 6d         11  %    FL/BPD:     75.2   %    71 - 87  FL:       69.3  mm     G. Age:  35w 4d        4.7  %    FL/AC:      21.7   %    20 - 24  LV:        2.7  mm  Est. FW:    2850  gm      6 lb 5 oz     16  % ---------------------------------------------------------------------- OB History  Gravidity:    1         Term:   0        Prem:   0        SAB:   0  TOP:          0       Ectopic:  0        Living:  0 ---------------------------------------------------------------------- Gestational Age  U/S Today:     36w 4d                                        EDD:   07/21/21  Best:          38w 1d     Det. By:  Loman Chroman  EDD:   07/10/21                                      (11/28/20) ---------------------------------------------------------------------- Anatomy  Cranium:               Appears normal         Aortic Arch:            Previously seen  Cavum:                 Previously seen        Ductal Arch:            Previously seen  Ventricles:            Appears normal         Diaphragm:              Previously seen  Choroid Plexus:        Previously seen        Stomach:                Appears normal, left                                                                        sided  Cerebellum:            Previously seen        Abdomen:                Previously seen  Posterior Fossa:       Previously seen        Abdominal Wall:         Previously seen  Nuchal Fold:           Not applicable (>02    Cord Vessels:           2 vessel cord,                         wks GA)                                                                        absent right umb a  Face:                  Orbits and profile     Kidneys:                Appear normal                         previously seen  Lips:                  Previously seen        Bladder:                Appears normal  Thoracic:  Previously seen        Spine:                  Previously seen  Heart:                 Appears normal         Upper Extremities:      Previously seen                         (4CH, axis, and                         situs)  RVOT:                  Previously seen        Lower Extremities:      Previously seen  LVOT:                  Previously seen  Other:  Female gender previously seen. VC, 3VV and 3VTV previously           visualized. ---------------------------------------------------------------------- Cervix Uterus Adnexa  Cervix  Not visualized (advanced GA >24wks)  Right Ovary  Not visualized.  Left Ovary  Not visualized. ---------------------------------------------------------------------- Comments  This patient was seen for a follow up growth scan due to a  fetus with a two-vessel cord.  She denies any problems since  her last exam.  She was informed that the fetal growth and amniotic fluid  level appears appropriate for her gestational age.  Fetal breathing movements were noted throughout today's  exam.  Due to a fetus with a two-vessel cord, delivery may be  considered at any time at 39 weeks or greater. ----------------------------------------------------------------------                   Johnell Comings, MD Electronically Signed Final Report   06/27/2021 04:05 pm ----------------------------------------------------------------------   Assessment/Plan: 35 year old G1P0 SIUP at 40 weeks with single umbilical artery and h/o IUGR Admit to L&D for induction of labor IV hydration Continuous monitoring Admission labs Epidural on demand Bedside US done confirming vertex Will start induction with buccal cytotec, then progress to Pitocin for augmentation    Ameera Tigue 07/10/2021, 2:48 PM

## 2021-07-10 NOTE — Progress Notes (Signed)
Krystal Cook is a 35 y.o. G1P0 at [redacted]w[redacted]d admitted for induction of labor due to single umbilical artery and SGA@ 16%.  Subjective:  Resting comfortably. C/O occasional back pain. POC discussed with pt. Questions answered.  Objective: BP 104/60 (BP Location: Left Arm)   Pulse 77   Temp 98.8 F (37.1 C) (Oral)   Resp 17   Ht 4\' 11"  (1.499 m)   Wt 75.1 kg   LMP 10/03/2020   BMI 33.45 kg/m  No intake/output data recorded. No intake/output data recorded.  FHT:  FHR: 135 bpm, variability: moderate,  accelerations:  Abscent,  decelerations:  Absent UC:   regular, every 1-4 minutes SVE:   Dilation: 3 Effacement (%): 70 Station: -2 Exam by:: Krystal Cook, CNM  Labs: Lab Results  Component Value Date   WBC 8.6 07/10/2021   HGB 12.2 07/10/2021   HCT 34.6 (L) 07/10/2021   MCV 88.5 07/10/2021   PLT 341 07/10/2021    Assessment / Plan: IOL for single umbilical artery and SGA.  Labor:  Cytotec buccal x2 Fetal Wellbeing:  Category I Pain Control:  Epidural PRN I/D:   GBS negative Anticipated MOD:  NSVD Dr Krystal Cook aware of pt status and Krystal Cook, CNM 07/10/2021, 11:32 PM

## 2021-07-10 NOTE — Progress Notes (Signed)
Krystal Cook is a 35 y.o. G1P0 at [redacted]w[redacted]d admitted for induction of labor due to single umbilical artery, SGA 82%..  Subjective:  Complaining of occasional back pain. Pitocin R/B/A discussed with pt and husband. Pt consented to pitocin.   Objective: BP 104/60 (BP Location: Left Arm)   Pulse 77   Temp 98.8 F (37.1 C) (Oral)   Resp 17   Ht 4\' 11"  (1.499 m)   Wt 75.1 kg   LMP 10/03/2020   BMI 33.45 kg/m  No intake/output data recorded. No intake/output data recorded.  FHT:  FHR: 130 bpm, variability: moderate,  accelerations:  Present,  decelerations:  Absent UC:   regular, every 1-3 minutes SVE:   Dilation: 3 Effacement (%): 70 Station: -2 Exam by:: [employer], CNM  Labs: Lab Results  Component Value Date   WBC 8.6 07/10/2021   HGB 12.2 07/10/2021   HCT 34.6 (L) 07/10/2021   MCV 88.5 07/10/2021   PLT 341 07/10/2021    Assessment / Plan: IOL for single umbilical artery and SGA @ 16 %.   Labor:  Cytotec buccal x 2, Start pitocin now. Titrate 2x2 Fetal Wellbeing:  Category I Pain Control:  Epidural prn I/D:   GBS negative Anticipated MOD:  NSVD  Theone Murdoch Sammye Staff MSN, CNM 07/10/2021, 11:19 PM

## 2021-07-11 ENCOUNTER — Inpatient Hospital Stay (HOSPITAL_COMMUNITY): Payer: PPO | Admitting: Anesthesiology

## 2021-07-11 LAB — RPR: RPR Ser Ql: NONREACTIVE

## 2021-07-11 MED ORDER — SODIUM CHLORIDE 0.9 % IV SOLN
3.0000 g | INTRAVENOUS | Status: AC
  Administered 2021-07-11: 3 g via INTRAVENOUS
  Filled 2021-07-11 (×2): qty 8

## 2021-07-11 MED ORDER — ACETAMINOPHEN 500 MG PO TABS
1000.0000 mg | ORAL_TABLET | Freq: Once | ORAL | Status: AC
  Administered 2021-07-11: 1000 mg via ORAL
  Filled 2021-07-11: qty 2

## 2021-07-11 MED ORDER — PHENYLEPHRINE 40 MCG/ML (10ML) SYRINGE FOR IV PUSH (FOR BLOOD PRESSURE SUPPORT)
80.0000 ug | PREFILLED_SYRINGE | INTRAVENOUS | Status: AC | PRN
  Administered 2021-07-11 (×3): 80 ug via INTRAVENOUS

## 2021-07-11 MED ORDER — FENTANYL-BUPIVACAINE-NACL 0.5-0.125-0.9 MG/250ML-% EP SOLN
EPIDURAL | Status: DC | PRN
  Administered 2021-07-11: 12 mL/h via EPIDURAL

## 2021-07-11 MED ORDER — EPHEDRINE 5 MG/ML INJ: 10.0000 mg | INTRAVENOUS | Status: DC | PRN

## 2021-07-11 MED ORDER — LIDOCAINE HCL (PF) 1 % IJ SOLN
INTRAMUSCULAR | Status: DC | PRN
  Administered 2021-07-11: 5 mL via EPIDURAL

## 2021-07-11 MED ORDER — DIPHENHYDRAMINE HCL 50 MG/ML IJ SOLN
12.5000 mg | INTRAMUSCULAR | Status: DC | PRN
Start: 2021-07-11 — End: 2021-07-12

## 2021-07-11 MED ORDER — PHENYLEPHRINE 40 MCG/ML (10ML) SYRINGE FOR IV PUSH (FOR BLOOD PRESSURE SUPPORT)
80.0000 ug | PREFILLED_SYRINGE | INTRAVENOUS | Status: AC | PRN
  Administered 2021-07-11 (×3): 80 ug via INTRAVENOUS
  Filled 2021-07-11 (×2): qty 10

## 2021-07-11 MED ORDER — SODIUM CHLORIDE 0.9 % IV SOLN
3.0000 g | Freq: Four times a day (QID) | INTRAVENOUS | Status: DC
Start: 2021-07-12 — End: 2021-07-12
  Filled 2021-07-11 (×2): qty 8

## 2021-07-11 MED ORDER — EPHEDRINE 5 MG/ML INJ
10.0000 mg | INTRAVENOUS | Status: AC | PRN
  Administered 2021-07-11 (×2): 10 mg via INTRAVENOUS
  Filled 2021-07-11: qty 5

## 2021-07-11 MED ORDER — LACTATED RINGERS IV SOLN
500.0000 mL | Freq: Once | INTRAVENOUS | Status: AC
  Administered 2021-07-11: 500 mL via INTRAVENOUS

## 2021-07-11 MED ORDER — FENTANYL-BUPIVACAINE-NACL 0.5-0.125-0.9 MG/250ML-% EP SOLN
12.0000 mL/h | EPIDURAL | Status: DC | PRN
  Filled 2021-07-11: qty 250

## 2021-07-11 NOTE — Anesthesia Preprocedure Evaluation (Signed)
Anesthesia Evaluation  Patient identified by MRN, date of birth, ID band Patient awake    Reviewed: Allergy & Precautions, NPO status , Patient's Chart, lab work & pertinent test results  Airway Mallampati: II  TM Distance: >3 FB Neck ROM: Full    Dental no notable dental hx. (+) Teeth Intact, Dental Advisory Given   Pulmonary neg pulmonary ROS,    Pulmonary exam normal breath sounds clear to auscultation       Cardiovascular Exercise Tolerance: Good negative cardio ROS Normal cardiovascular exam Rhythm:Regular Rate:Normal     Neuro/Psych negative neurological ROS  negative psych ROS   GI/Hepatic negative GI ROS, Neg liver ROS,   Endo/Other    Renal/GU negative Renal ROS     Musculoskeletal   Abdominal   Peds  Hematology Lab Results      Component                Value               Date                      WBC                      8.6                 07/10/2021                HGB                      12.2                07/10/2021                HCT                      34.6 (L)            07/10/2021                MCV                      88.5                07/10/2021                PLT                      341                 07/10/2021              Anesthesia Other Findings   Reproductive/Obstetrics (+) Pregnancy                             Anesthesia Physical Anesthesia Plan  ASA: 2  Anesthesia Plan: Epidural   Post-op Pain Management:    Induction:   PONV Risk Score and Plan: Treatment may vary due to age or medical condition  Airway Management Planned:   Additional Equipment: None  Intra-op Plan:   Post-operative Plan:   Informed Consent: I have reviewed the patients History and Physical, chart, labs and discussed the procedure including the risks, benefits and alternatives for the proposed anesthesia with the patient or authorized representative who has indicated  his/her understanding and acceptance.  Plan Discussed with:   Anesthesia Plan Comments: (40.1 wk Primagravida for LEA)        Anesthesia Quick Evaluation

## 2021-07-11 NOTE — Progress Notes (Signed)
Subjective:    Resting comfortably. C/O rectal pain. SVE. IUPC placed with consent, without difficulty. Baby positional. Occasional lates, variables with position changes. Spontaneous return to baseline, moderate variability and accels.   Objective:    VS: BP (!) 98/58   Pulse 82   Temp 98.3 F (36.8 C) (Oral)   Resp 16   Ht 4\' 11"  (1.499 m)   Wt 75.1 kg   LMP 10/03/2020   SpO2 99%   BMI 33.45 kg/m  FHR : baseline 150 / variability moderate / accelerations present / occasional lates and variables decelerations with position changes Toco: contractions every 1-4 minutes /  Membranes: SROM, clear fluid @ 1015 Dilation: 4.5 Effacement (%): 90 Cervical Position: Anterior Station: -1 Presentation: Vertex Exam by:: K Birdia Jaycox CNM Pitocin 6 mU/min  Assessment/Plan:   35 y.o. G1P0 [redacted]w[redacted]d IOL for single umbilical artery and SGA @ 16%  Labor:  Protracted latent phase. IUPC placed.  On Pitocin. Preeclampsia:  no signs or symptoms of toxicity Fetal Wellbeing:  Category I Cat 2 with position changes. Pain Control:  Epidural I/D:   GBS negative Anticipated MOD:  NSVD  Domingo Pulse MSN, CNM 07/11/2021 10:52 AM

## 2021-07-11 NOTE — Progress Notes (Signed)
Subjective:    Febrile with temp 102.3. Tylenol and unasyn 3gms given.   Objective:    VS: BP 122/67   Pulse (!) 153   Temp (!) 102.3 F (39.1 C) (Oral)   Resp 16   Ht 4\' 11"  (1.499 m)   Wt 75.1 kg   LMP 10/03/2020   SpO2 99%   BMI 33.45 kg/m  FHR : baseline 165 / variability moderate / accelerations present / absent decelerations Toco: contractions every 1-3 minutes / Membranes: SROM @ 1015 Dilation: Lip/rim Effacement (%): 100 Cervical Position: Anterior Station: 0 Presentation: Vertex Exam by:: K Faucett RN Pitocin 3 mU/min  Assessment/Plan:   35 y.o. G1P0 [redacted]w[redacted]d IOL for single umbilical artery and SGA  Labor: Progressing normally on pitocin Preeclampsia:  no signs or symptoms of toxicity Fetal Wellbeing:  Category I Pain Control:  Epidural I/D:   GBS negative Anticipated MOD:  NSVD  Domingo Pulse MSN, CNM 07/11/2021 8:10 PM

## 2021-07-11 NOTE — Progress Notes (Signed)
Subjective:    Called by RN to check pt. She is feeling more pressure. SVE 8-9/90/0. Position changed to high fowlers.   Objective:    VS: BP 104/61   Pulse 73   Temp 99.7 F (37.6 C) (Oral)   Resp 20   Ht 4\' 11"  (1.499 m)   Wt 75.1 kg   LMP 10/03/2020   SpO2 99%   BMI 33.45 kg/m  FHR : baseline 150 / variability moderate / accelerations present / absent decelerations Toco: contractions every 2-3 minutes /  Membranes: SROM @ 1015 Dilation: 8.5 Effacement (%): 90 Cervical Position: Anterior Station: 0 Presentation: Vertex Exam by:: K Cedricka Sackrider CNM Pitocin 2 mU/min  Assessment/Plan:   35 y.o. G1P0 [redacted]w[redacted]d IOL for single umbilical artery and SGA @ 16%  Labor: Progressing normally on 31mu on pitocin Preeclampsia:  no signs or symptoms of toxicity Fetal Wellbeing:  Category I Pain Control:  Epidural I/D:  GBS negative Anticipated MOD:  NSVD  Dr Alesia Richards notified of pt status and [place] MSN, CNM 07/11/2021 3:53 PM

## 2021-07-11 NOTE — Progress Notes (Signed)
Subjective:    Pt comfortable with epidural. Multiple questions about epidural and POC discussed with pt and husband. Pt and husband decline AROM at present time. Will reevaluate at next SVE. R/B/A of AROM and IUPC placement discussed. Pt verbalized understanding.   Objective:    VS: BP (!) 91/54   Pulse 81   Temp 98.7 F (37.1 C) (Oral)   Resp 16   Ht 4\' 11"  (1.499 m)   Wt 75.1 kg   LMP 10/03/2020   SpO2 99%   BMI 33.45 kg/m  FHR : baseline 140 / variability moderate / accelerations present / absent decelerations Toco: contractions every 1-3 minutes /  Membranes: intact Dilation: 4 Effacement (%): 80 Cervical Position: Anterior Station: -1 Presentation: Vertex Exam by:: Kim Rector [place], CNM Pitocin 6 mU/min  Assessment/Plan:   35 y.o. G1P0 [redacted]w[redacted]d IOL for single umbilical artery and SGA 16%.   Labor: Progressing on Pitocin, will continue to increase then AROM Preeclampsia:  no signs or symptoms of toxicity Fetal Wellbeing:  Category I Pain Control:  Epidural I/D:  GBS negative Anticipated MOD:  NSVD Dr Delora Fuel aware of pt status and [place] MSN, CNM 07/11/2021 6:49 AM

## 2021-07-11 NOTE — Progress Notes (Signed)
FHR repeated lates, repositioned patient to right side, turned off pitocin, notified Kim CNM,Asked if Dr Alesia Richards aware and Maudie Mercury stated yes.

## 2021-07-11 NOTE — Anesthesia Procedure Notes (Signed)
Epidural Patient location during procedure: OB Start time: 07/11/2021 2:40 AM End time: 07/11/2021 2:58 AM  Staffing Anesthesiologist: Barnet Glasgow, MD Performed: anesthesiologist   Preanesthetic Checklist Completed: patient identified, IV checked, site marked, risks and benefits discussed, surgical consent, monitors and equipment checked, pre-op evaluation and timeout performed  Epidural Patient position: sitting Prep: DuraPrep and site prepped and draped Patient monitoring: continuous pulse ox and blood pressure Approach: midline Location: L3-L4 Injection technique: LOR air  Needle:  Needle type: Tuohy  Needle gauge: 17 G Needle length: 9 cm and 9 Needle insertion depth: 7 cm Catheter type: closed end flexible Catheter size: 19 Gauge Catheter at skin depth: 12 cm Test dose: negative  Assessment Events: blood not aspirated, injection not painful, no injection resistance, no paresthesia and negative IV test  Additional Notes Patient identified. Risks/Benefits/Options discussed with patient including but not limited to bleeding, infection, nerve damage, paralysis, failed block, incomplete pain control, headache, blood pressure changes, nausea, vomiting, reactions to medication both or allergic, itching and postpartum back pain. Confirmed with bedside nurse the patient's most recent platelet count. Confirmed with patient that they are not currently taking any anticoagulation, have any bleeding history or any family history of bleeding disorders. Patient expressed understanding and wished to proceed. All questions were answered. Sterile technique was used throughout the entire procedure. Please see nursing notes for vital signs. Test dose was given through epidural needle and negative prior to continuing to dose epidural or start infusion. Warning signs of high block given to the patient including shortness of breath, tingling/numbness in hands, complete motor block, or any  concerning symptoms with instructions to call for help. Patient was given instructions on fall risk and not to get out of bed. All questions and concerns addressed with instructions to call with any issues.  1 Attempt (S) . Patient tolerated procedure well.

## 2021-07-11 NOTE — Progress Notes (Signed)
Subjective:    Pt in hands and knees. Doing well. Repositioned to left lateral with peanut ball. Pitocin @ 31mu. Will continue to have occasional lates especially with position change. Overall moderate variability and accelerations. Rns notified to change positions slowly. Dr Alesia Richards aware of tracing.   Objective:    VS: BP 104/61   Pulse 73   Temp 98.7 F (37.1 C) (Axillary)   Resp 16   Ht 4\' 11"  (1.499 m)   Wt 75.1 kg   LMP 10/03/2020   SpO2 99%   BMI 33.45 kg/m  FHR : baseline 140 / variability moderate / accelerations present / absent decelerations Toco: contractions every 2-3 minutes / Membranes: SROM @ 1015 Dilation: 6 Effacement (%): 90 Cervical Position: Anterior Station: -1 Presentation: Vertex Exam by:: Krystal Cook CNM Pitocin 2 mU/min  Assessment/Plan:   35 y.o. G1P0 [redacted]w[redacted]d IOL for single umbilical artery and SGA @ 16%  Labor:  Progressing on 2 mu of pitocin. Pitocin to stay at 2 mu unless ctxs space and CAT 1 tracing. Preeclampsia:  no signs or symptoms of toxicity Fetal Wellbeing:  Category I at present, hx of CAT 2 with late decelerations especially with position changes. Pain Control:  Epidural I/D:   GBS negative Anticipated MOD:  NSVD   Domingo Pulse MSN, CNM 07/11/2021 2:40 PM

## 2021-07-12 ENCOUNTER — Encounter (HOSPITAL_COMMUNITY): Payer: Self-pay | Admitting: Obstetrics & Gynecology

## 2021-07-12 ENCOUNTER — Encounter (HOSPITAL_COMMUNITY)
Admission: AD | Disposition: A | Discharge status: Home / Self Care | Payer: Self-pay | Source: Home / Self Care | Attending: Obstetrics & Gynecology

## 2021-07-12 DIAGNOSIS — Z98891 History of uterine scar from previous surgery: Secondary | ICD-10-CM

## 2021-07-12 LAB — CBC
HCT: 33.7 % — ABNORMAL LOW (ref 36.0–46.0)
Hemoglobin: 11.8 g/dL — ABNORMAL LOW (ref 12.0–15.0)
MCH: 30.8 pg (ref 26.0–34.0)
MCHC: 35 g/dL (ref 30.0–36.0)
MCV: 88 fL (ref 80.0–100.0)
Platelets: 243 10*3/uL (ref 150–400)
RBC: 3.83 MIL/uL — ABNORMAL LOW (ref 3.87–5.11)
RDW: 13.8 % (ref 11.5–15.5)
WBC: 19.4 10*3/uL — ABNORMAL HIGH (ref 4.0–10.5)
nRBC: 0 % (ref 0.0–0.2)

## 2021-07-12 SURGERY — Surgical Case
Anesthesia: Epidural

## 2021-07-12 MED ORDER — MORPHINE SULFATE (PF) 0.5 MG/ML IJ SOLN
INTRAMUSCULAR | Status: AC
  Filled 2021-07-12: qty 10

## 2021-07-12 MED ORDER — HYDROMORPHONE HCL 1 MG/ML IJ SOLN: 0.2500 mg | INTRAMUSCULAR | Status: DC | PRN

## 2021-07-12 MED ORDER — NALBUPHINE HCL 10 MG/ML IJ SOLN: 5.0000 mg | Freq: Once | INTRAMUSCULAR | Status: DC | PRN

## 2021-07-12 MED ORDER — KETOROLAC TROMETHAMINE 30 MG/ML IJ SOLN
30.0000 mg | Freq: Once | INTRAMUSCULAR | Status: AC | PRN
  Administered 2021-07-12: 30 mg via INTRAVENOUS

## 2021-07-12 MED ORDER — LACTATED RINGERS IV SOLN: INTRAVENOUS | Status: DC | PRN

## 2021-07-12 MED ORDER — DIPHENHYDRAMINE HCL 25 MG PO CAPS: 25.0000 mg | ORAL_CAPSULE | Freq: Four times a day (QID) | ORAL | Status: DC | PRN

## 2021-07-12 MED ORDER — SENNOSIDES-DOCUSATE SODIUM 8.6-50 MG PO TABS
2.0000 | ORAL_TABLET | Freq: Every day | ORAL | Status: DC
  Administered 2021-07-12 – 2021-07-14 (×3): 2 via ORAL
  Filled 2021-07-12 (×3): qty 2

## 2021-07-12 MED ORDER — ONDANSETRON HCL 4 MG/2ML IJ SOLN: 4.0000 mg | Freq: Once | INTRAMUSCULAR | Status: DC | PRN

## 2021-07-12 MED ORDER — PRENATAL MULTIVITAMIN CH
1.0000 | ORAL_TABLET | Freq: Every day | ORAL | Status: DC
  Administered 2021-07-12 – 2021-07-14 (×3): 1 via ORAL
  Filled 2021-07-12 (×3): qty 1

## 2021-07-12 MED ORDER — OXYTOCIN-SODIUM CHLORIDE 30-0.9 UT/500ML-% IV SOLN
INTRAVENOUS | Status: AC
  Filled 2021-07-12: qty 500

## 2021-07-12 MED ORDER — ONDANSETRON HCL 4 MG/2ML IJ SOLN
INTRAMUSCULAR | Status: DC | PRN
  Administered 2021-07-12: 4 mg via INTRAVENOUS

## 2021-07-12 MED ORDER — SODIUM CHLORIDE 0.9% FLUSH: 3.0000 mL | INTRAVENOUS | Status: DC | PRN

## 2021-07-12 MED ORDER — DIPHENHYDRAMINE HCL 25 MG PO CAPS
25.0000 mg | ORAL_CAPSULE | ORAL | Status: DC | PRN
  Administered 2021-07-12: 25 mg via ORAL
  Filled 2021-07-12: qty 1

## 2021-07-12 MED ORDER — OXYCODONE HCL 5 MG PO TABS: 5.0000 mg | ORAL_TABLET | Freq: Once | ORAL | Status: DC | PRN

## 2021-07-12 MED ORDER — NALOXONE HCL 4 MG/10ML IJ SOLN
1.0000 ug/kg/h | INTRAVENOUS | Status: DC | PRN
  Filled 2021-07-12: qty 5

## 2021-07-12 MED ORDER — DEXAMETHASONE SODIUM PHOSPHATE 4 MG/ML IJ SOLN
INTRAMUSCULAR | Status: AC
  Filled 2021-07-12: qty 1

## 2021-07-12 MED ORDER — COCONUT OIL OIL: 1.0000 "application " | TOPICAL_OIL | Status: DC | PRN

## 2021-07-12 MED ORDER — DIPHENHYDRAMINE HCL 50 MG/ML IJ SOLN: 12.5000 mg | INTRAMUSCULAR | Status: DC | PRN

## 2021-07-12 MED ORDER — SIMETHICONE 80 MG PO CHEW
80.0000 mg | CHEWABLE_TABLET | Freq: Three times a day (TID) | ORAL | Status: DC
  Administered 2021-07-12 – 2021-07-14 (×6): 80 mg via ORAL
  Filled 2021-07-12 (×6): qty 1

## 2021-07-12 MED ORDER — AMPICILLIN-SULBACTAM SODIUM 3 (2-1) G IJ SOLR
3.0000 g | Freq: Four times a day (QID) | INTRAMUSCULAR | Status: AC
  Administered 2021-07-12 (×2): 3 g via INTRAVENOUS
  Filled 2021-07-12 (×3): qty 8

## 2021-07-12 MED ORDER — SIMETHICONE 80 MG PO CHEW
80.0000 mg | CHEWABLE_TABLET | ORAL | Status: DC | PRN
  Administered 2021-07-14: 80 mg via ORAL
  Filled 2021-07-12: qty 1

## 2021-07-12 MED ORDER — NALBUPHINE HCL 10 MG/ML IJ SOLN: 5.0000 mg | INTRAMUSCULAR | Status: DC | PRN

## 2021-07-12 MED ORDER — WITCH HAZEL-GLYCERIN EX PADS: 1.0000 "application " | MEDICATED_PAD | CUTANEOUS | Status: DC | PRN

## 2021-07-12 MED ORDER — LIDOCAINE-EPINEPHRINE (PF) 2 %-1:200000 IJ SOLN
INTRAMUSCULAR | Status: DC | PRN
  Administered 2021-07-12 (×2): 4 mL via EPIDURAL

## 2021-07-12 MED ORDER — SODIUM CHLORIDE 0.9 % IR SOLN
Status: DC | PRN
  Administered 2021-07-12: 1

## 2021-07-12 MED ORDER — FENTANYL CITRATE (PF) 100 MCG/2ML IJ SOLN
INTRAMUSCULAR | Status: AC
  Filled 2021-07-12: qty 2

## 2021-07-12 MED ORDER — ACETAMINOPHEN 500 MG PO TABS
1000.0000 mg | ORAL_TABLET | Freq: Four times a day (QID) | ORAL | Status: DC
  Administered 2021-07-12 – 2021-07-14 (×9): 1000 mg via ORAL
  Filled 2021-07-12 (×9): qty 2

## 2021-07-12 MED ORDER — CEFAZOLIN SODIUM-DEXTROSE 2-4 GM/100ML-% IV SOLN
2.0000 g | INTRAVENOUS | Status: AC
  Administered 2021-07-12: 2 g via INTRAVENOUS

## 2021-07-12 MED ORDER — SALINE SPRAY 0.65 % NA SOLN
1.0000 | NASAL | Status: DC | PRN
  Filled 2021-07-12: qty 44

## 2021-07-12 MED ORDER — FENTANYL CITRATE (PF) 100 MCG/2ML IJ SOLN
INTRAMUSCULAR | Status: DC | PRN
  Administered 2021-07-12: 100 ug via EPIDURAL

## 2021-07-12 MED ORDER — MENTHOL 3 MG MT LOZG: 1.0000 | LOZENGE | OROMUCOSAL | Status: DC | PRN

## 2021-07-12 MED ORDER — IBUPROFEN 600 MG PO TABS
600.0000 mg | ORAL_TABLET | Freq: Four times a day (QID) | ORAL | Status: DC
  Administered 2021-07-13 – 2021-07-14 (×6): 600 mg via ORAL
  Filled 2021-07-12 (×6): qty 1

## 2021-07-12 MED ORDER — DIBUCAINE (PERIANAL) 1 % EX OINT: 1.0000 "application " | TOPICAL_OINTMENT | CUTANEOUS | Status: DC | PRN

## 2021-07-12 MED ORDER — ONDANSETRON HCL 4 MG/2ML IJ SOLN
INTRAMUSCULAR | Status: AC
  Filled 2021-07-12: qty 2

## 2021-07-12 MED ORDER — MORPHINE SULFATE (PF) 0.5 MG/ML IJ SOLN
INTRAMUSCULAR | Status: DC | PRN
  Administered 2021-07-12: 3 mg via EPIDURAL

## 2021-07-12 MED ORDER — ACETAMINOPHEN 10 MG/ML IV SOLN
INTRAVENOUS | Status: DC | PRN
  Administered 2021-07-12: 1000 mg via INTRAVENOUS

## 2021-07-12 MED ORDER — OXYTOCIN-SODIUM CHLORIDE 30-0.9 UT/500ML-% IV SOLN: 2.5000 [IU]/h | INTRAVENOUS | Status: AC

## 2021-07-12 MED ORDER — STERILE WATER FOR IRRIGATION IR SOLN
Status: DC | PRN
  Administered 2021-07-12: 1000 mL

## 2021-07-12 MED ORDER — DEXAMETHASONE SODIUM PHOSPHATE 4 MG/ML IJ SOLN
INTRAMUSCULAR | Status: DC | PRN
  Administered 2021-07-12: 4 mg via INTRAVENOUS

## 2021-07-12 MED ORDER — PHENYLEPHRINE 40 MCG/ML (10ML) SYRINGE FOR IV PUSH (FOR BLOOD PRESSURE SUPPORT)
PREFILLED_SYRINGE | INTRAVENOUS | Status: AC
  Filled 2021-07-12: qty 10

## 2021-07-12 MED ORDER — TETANUS-DIPHTH-ACELL PERTUSSIS 5-2.5-18.5 LF-MCG/0.5 IM SUSY: 0.5000 mL | PREFILLED_SYRINGE | Freq: Once | INTRAMUSCULAR | Status: DC

## 2021-07-12 MED ORDER — OXYCODONE HCL 5 MG PO TABS
5.0000 mg | ORAL_TABLET | ORAL | Status: DC | PRN
  Administered 2021-07-13: 5 mg via ORAL
  Administered 2021-07-13: 10 mg via ORAL
  Administered 2021-07-14: 5 mg via ORAL
  Filled 2021-07-12 (×2): qty 1
  Filled 2021-07-12: qty 2
  Filled 2021-07-12: qty 1

## 2021-07-12 MED ORDER — KETOROLAC TROMETHAMINE 30 MG/ML IJ SOLN
30.0000 mg | Freq: Four times a day (QID) | INTRAMUSCULAR | Status: AC
  Administered 2021-07-12 – 2021-07-13 (×3): 30 mg via INTRAVENOUS
  Filled 2021-07-12 (×3): qty 1

## 2021-07-12 MED ORDER — ZOLPIDEM TARTRATE 5 MG PO TABS: 5.0000 mg | ORAL_TABLET | Freq: Every evening | ORAL | Status: DC | PRN

## 2021-07-12 MED ORDER — ACETAMINOPHEN 10 MG/ML IV SOLN
INTRAVENOUS | Status: AC
  Filled 2021-07-12: qty 100

## 2021-07-12 MED ORDER — NALOXONE HCL 0.4 MG/ML IJ SOLN: 0.4000 mg | INTRAMUSCULAR | Status: DC | PRN

## 2021-07-12 MED ORDER — SCOPOLAMINE 1 MG/3DAYS TD PT72
MEDICATED_PATCH | TRANSDERMAL | Status: AC
  Filled 2021-07-12: qty 1

## 2021-07-12 MED ORDER — PHENYLEPHRINE HCL (PRESSORS) 10 MG/ML IV SOLN
INTRAVENOUS | Status: DC | PRN
  Administered 2021-07-12 (×4): 80 ug via INTRAVENOUS

## 2021-07-12 MED ORDER — OXYTOCIN-SODIUM CHLORIDE 30-0.9 UT/500ML-% IV SOLN
INTRAVENOUS | Status: DC | PRN
  Administered 2021-07-12: 50 mL via INTRAVENOUS
  Administered 2021-07-12: 500 mL via INTRAVENOUS

## 2021-07-12 MED ORDER — SCOPOLAMINE 1 MG/3DAYS TD PT72: 1.0000 | MEDICATED_PATCH | Freq: Once | TRANSDERMAL | Status: DC

## 2021-07-12 MED ORDER — ONDANSETRON HCL 4 MG/2ML IJ SOLN: 4.0000 mg | Freq: Three times a day (TID) | INTRAMUSCULAR | Status: DC | PRN

## 2021-07-12 MED ORDER — SOD CITRATE-CITRIC ACID 500-334 MG/5ML PO SOLN
30.0000 mL | ORAL | Status: AC
  Administered 2021-07-12: 30 mL via ORAL

## 2021-07-12 MED ORDER — KETOROLAC TROMETHAMINE 30 MG/ML IJ SOLN
INTRAMUSCULAR | Status: AC
  Filled 2021-07-12: qty 1

## 2021-07-12 MED ORDER — OXYCODONE HCL 5 MG/5ML PO SOLN
5.0000 mg | Freq: Once | ORAL | Status: DC | PRN
Start: 2021-07-12 — End: 2021-07-12

## 2021-07-12 SURGICAL SUPPLY — 37 items
CHLORAPREP W/TINT 26ML (MISCELLANEOUS) ×2 IMPLANT
CLAMP CORD UMBIL (MISCELLANEOUS) IMPLANT
CLOTH BEACON ORANGE TIMEOUT ST (SAFETY) ×2 IMPLANT
DERMABOND ADHESIVE PROPEN (GAUZE/BANDAGES/DRESSINGS) ×1
DERMABOND ADVANCED (GAUZE/BANDAGES/DRESSINGS)
DERMABOND ADVANCED .7 DNX12 (GAUZE/BANDAGES/DRESSINGS) IMPLANT
DERMABOND ADVANCED .7 DNX6 (GAUZE/BANDAGES/DRESSINGS) ×1 IMPLANT
DRAPE C SECTION CLR SCREEN (DRAPES) ×2 IMPLANT
DRSG OPSITE POSTOP 4X10 (GAUZE/BANDAGES/DRESSINGS) ×2 IMPLANT
ELECT REM PT RETURN 9FT ADLT (ELECTROSURGICAL) ×2
ELECTRODE REM PT RTRN 9FT ADLT (ELECTROSURGICAL) ×1 IMPLANT
EXTRACTOR VACUUM M CUP 4 TUBE (SUCTIONS) IMPLANT
GLOVE BIOGEL PI IND STRL 7.0 (GLOVE) ×2 IMPLANT
GLOVE BIOGEL PI INDICATOR 7.0 (GLOVE) ×2
GLOVE SURG SS PI 6.5 STRL IVOR (GLOVE) ×2 IMPLANT
GOWN STRL REUS W/TWL LRG LVL3 (GOWN DISPOSABLE) ×4 IMPLANT
KIT ABG SYR 3ML LUER SLIP (SYRINGE) IMPLANT
NEEDLE HYPO 25X5/8 SAFETYGLIDE (NEEDLE) IMPLANT
NS IRRIG 1000ML POUR BTL (IV SOLUTION) ×2 IMPLANT
PACK C SECTION WH (CUSTOM PROCEDURE TRAY) ×2 IMPLANT
PAD OB MATERNITY 4.3X12.25 (PERSONAL CARE ITEMS) ×2 IMPLANT
PENCIL SMOKE EVAC W/HOLSTER (ELECTROSURGICAL) ×2 IMPLANT
RTRCTR C-SECT PINK 25CM LRG (MISCELLANEOUS) ×2 IMPLANT
SUT CHROMIC 1 CTX 36 (SUTURE) IMPLANT
SUT CHROMIC 2 0 CT 1 (SUTURE) ×2 IMPLANT
SUT MON AB 4-0 PS1 27 (SUTURE) ×2 IMPLANT
SUT PLAIN 1 NONE 54 (SUTURE) IMPLANT
SUT PLAIN 2 0 (SUTURE) ×2
SUT PLAIN 2 0 XLH (SUTURE) IMPLANT
SUT PLAIN ABS 2-0 CT1 27XMFL (SUTURE) ×2 IMPLANT
SUT VIC AB 0 CTX 36 (SUTURE) ×1
SUT VIC AB 0 CTX36XBRD ANBCTRL (SUTURE) ×1 IMPLANT
SUT VIC AB 1 CTX 36 (SUTURE) ×2
SUT VIC AB 1 CTX36XBRD ANBCTRL (SUTURE) ×2 IMPLANT
TOWEL OR 17X24 6PK STRL BLUE (TOWEL DISPOSABLE) ×2 IMPLANT
TRAY FOLEY W/BAG SLVR 14FR LF (SET/KITS/TRAYS/PACK) ×2 IMPLANT
WATER STERILE IRR 1000ML POUR (IV SOLUTION) ×2 IMPLANT

## 2021-07-12 NOTE — Transfer of Care (Signed)
Immediate Anesthesia Transfer of Care Note  Patient: Krystal Cook  Procedure(s) Performed: CESAREAN SECTION  Patient Location: PACU  Anesthesia Type:Epidural  Level of Consciousness: awake, alert  and patient cooperative  Airway & Oxygen Therapy: Patient Spontanous Breathing  Post-op Assessment: Report given to RN and Post -op Vital signs reviewed and stable  Post vital signs: Reviewed and stable  Last Vitals:  Vitals Value Taken Time  BP 105/72 07/12/21 0215  Temp    Pulse 85 07/12/21 0217  Resp 21 07/12/21 0217  SpO2 98 % 07/12/21 0217  Vitals shown include unvalidated device data.  Last Pain:  Vitals:   07/11/21 2350  TempSrc: Axillary  PainSc:          Complications: No notable events documented.

## 2021-07-12 NOTE — Lactation Note (Signed)
This note was copied from a baby's chart. Lactation Consultation Note  Patient Name: Krystal Cook Date: 07/12/2021 Reason for consult: Initial assessment;1st time breastfeeding;Primapara;Term Age:35 hours  LC in to visit with P51 Mom of term baby.  Baby born by C/S after 2 hrs of pushing and a vacuum extraction attempt.  Baby also has a small incision on her back from surgery.   Parents are concerned that baby hasn't latched and eaten yet.  Mom is very exhausted and sore from birth.  Mom is afraid to move in bed.  Baby swaddled in crib sleeping.    Reviewed breast massage and hand expression.  Small drop of colostrum noted.  Mom return demonstrated this.    Placed baby STS in football hold with drop of colostrum onto nipple.  Assisted Mom with proper hand placement to support baby's head and her breast.  Mom has short nipples that evert with hand expression and very compressible areola.  After baby showed no interest to latch, and colostrum placed on baby's mouth, baby placed STS chest to chest on Mom.  Talked to parents about importance of continued STS until baby is consistently latching and breastfeeding.   Mom will call for assistance when baby starts showing early feeding cues.    Lactation brochure provided and left in room.  Parents aware of IP and OP lactation support available to her.   Maternal Data Has patient been taught Hand Expression?: Yes Does the patient have breastfeeding experience prior to this delivery?: No  Feeding Mother's Current Feeding Choice: Breast Milk  LATCH Score Latch: Too sleepy or reluctant, no latch achieved, no sucking elicited.  Audible Swallowing: None  Type of Nipple: Everted at rest and after stimulation (short nipples)  Comfort (Breast/Nipple): Soft / non-tender  Hold (Positioning): Full assist, staff holds infant at breast  LATCH Score: 4   Interventions Interventions: Breast feeding basics reviewed;Assisted with  latch;Skin to skin;Breast massage;Hand express;Adjust position;Support pillows;Position options;Education   Consult Status Consult Status: Follow-up Date: 07/12/21 Follow-up type: [place] 07/12/2021, 8:22 AM

## 2021-07-12 NOTE — Progress Notes (Signed)
Subjective: POD# 0 - Primary c/s Information for the patient's newborn:  Indigo, Barbian Girl Rosmery [[id number]]  female    Reports feeling tired and sore. Feeding: breast Reports tolerating PO and denies N/V, foley removed, ambulating and urinating w/o difficulty  Pain controlled with  PO medications.  Denies HA/SOB/dizziness  Flatus positive Vaginal bleeding is normal, no clots     Objective:  VS:  Vitals:   07/12/21 0400 07/12/21 0500 07/12/21 0600 07/12/21 0700  BP: 111/67 103/62 110/64 106/78  Pulse: 61 63 65 75  Resp: 18 20 20 18   Temp: 98.7 F (37.1 C) 98.7 F (37.1 C) 98.5 F (36.9 C) 98.5 F (36.9 C)  TempSrc: Oral Oral Oral Oral  SpO2: 96% 98% 98% 99%  Weight:      Height:        Intake/Output Summary (Last 24 hours) at 07/12/2021 1110 Last data filed at 07/12/2021 0700 Gross per 24 hour  Intake 2100 ml  Output 3265 ml  Net -1165 ml     Recent Labs    07/10/21 1345 07/12/21 0549  WBC 8.6 19.4*  HGB 12.2 11.8*  HCT 34.6* 33.7*  PLT 341 243    Blood type: --/--/A POS (10/05 1345) Rubella: Immune (03/02 0000)    Physical Exam:  General: alert and cooperative CV: Regular rate and rhythm Resp: clear Abdomen: soft, nontender, normal bowel sounds Incision: dressing clean, dry and intact Uterine Fundus: firm, below umbilicus, nontender Lochia: minimal Ext: extremities normal, atraumatic, no cyanosis or edema   Assessment/Plan: 35 y.o.   POD# 0. G1P0                  Principal Problem:   Single umbilical artery Active Problems:   AMA (advanced maternal age) multigravida 35+   Routine post-op PP care          Advance diet as tolerated Advised warm fluids and ambulation to improve GI motility Breastfeeding support Anticipate D/C 07/14/2021  Kathalene Frames, MSN, CNM 07/12/2021, 11:10 AM

## 2021-07-12 NOTE — Progress Notes (Signed)
Krystal Cook is a 35 y.o. G1P0 at [redacted]w[redacted]d by  admitted for induction of labor for single umbilical artery  Subjective: Patient feeling strong contractions and pushing.   Objective: BP 100/60   Pulse 86   Temp 99.4 F (37.4 C) (Axillary)   Resp 15   Ht 4\' 11"  (1.499 m)   Wt 75.1 kg   LMP 10/03/2020   SpO2 97%   BMI 33.45 kg/m  I/O last 3 completed shifts: In: -  Out: 6269 [Urine:1750] Total I/O In: -  Out: 250 [Urine:250]  FHT:  FHR: 180 bpm, variability: moderate,  accelerations:  Present,  decelerations:  Present Lates and variables .  UC:   irregular, every 2 to 4 minutes SVE:   Dilation: 10 Effacement (%): 100 Station: +2 Exam by:: Dr. Alesia Richards After explaining risks, benefits and alternatives of the procedure vacuum assisted vaginal delivery attempted with Kiwi Vacuum. 3 pulls, 1 pop off, pressure maintained in green zone with little fetal descent.   Labs: Lab Results  Component Value Date   WBC 8.6 07/10/2021   HGB 12.2 07/10/2021   HCT 34.6 (L) 07/10/2021   MCV 88.5 07/10/2021   PLT 341 07/10/2021    Assessment / Plan: Abnormal fetal heart tracing in second stage of labor and remote from delivery via vaginal route,  Labor: Recommendation was made to proceed with a cesarean delivery and the patient agrees. We discussed risks, benefits and alternatives of cesarean delivery including risks of bleeding, infection, damage to organs.  All  her questions were answered and she was consented for the procedure.   Fetal Wellbeing:  Category II Pain Control:  Epidural I/D:   With maternal fever and fetal tachycardia. Patient is receiving Unasyn and acetaminophen.  Anticipated MOD:   Cesarean delivery.   Archie Endo, MD.  07/12/2021, 12:37 AM

## 2021-07-12 NOTE — Anesthesia Postprocedure Evaluation (Signed)
Anesthesia Post Note  Patient: Kemari Bogacki  Procedure(s) Performed: [place]     Patient location during evaluation: Mother Baby Anesthesia Type: Epidural Level of consciousness: oriented and awake and alert Pain management: pain level controlled Vital Signs Assessment: post-procedure vital signs reviewed and stable Respiratory status: spontaneous breathing and respiratory function stable Cardiovascular status: blood pressure returned to baseline and stable Postop Assessment: no headache, no backache, no apparent nausea or vomiting and able to ambulate Anesthetic complications: no   No notable events documented.  Last Vitals:  Vitals:   07/12/21 0500 07/12/21 0600  BP: 103/62 110/64  Pulse: 63 65  Resp: 20 20  Temp: 37.1 C 36.9 C  SpO2: 98% 98%    Last Pain:  Vitals:   07/12/21 0600  TempSrc: Oral  PainSc: 2    Pain Goal: Patients Stated Pain Goal: 1 (07/12/21 0400)                 Barnet Glasgow

## 2021-07-12 NOTE — Op Note (Signed)
Patient: Krystal Cook DOB: 1986-02-02 MRN: [id number]  DATE OF SURGERY:  07/12/2021  PREOP DIAGNOSIS:  1. 40 week 2 day  EGA intrauterine pregnancy  2. Abnormal fetal heart tracing in second stage of labor with fetal tachycardia as well as late and deep variable decelerations.   3. Remote from delivery. 4. Failed vacuum assisted delivery.  5. Maternal fever and suspected chorioamnionitis. 6. Induction of labor for single umbilical artery and small for gestational age fetus.   POSTOP DIAGNOSIS: Same as above.  PROCEDURE: Primary low uterine segment transverse cesarean section via Pfannenstiel incision.     SURGEON: Dr.  Waymon Amato  ASSISTANT:Kimberly Holshouser, CNM   ANESTHESIA: Bolused epidural  COMPLICATIONS: None  FINDINGS: Viable female infant in cephalic presentation, DOP, weight pending, Apgar scores of 5 and 8. Normal uterus and fallopian tubes and ovaries bilaterally.    EBL:  540 cc  IV FLUID:  2500 c LR   URINE OUTPUT: 350 cc blood tinged urine that was clearing up.    INDICATIONS: 35 y/o G1P0 who was taken for a cesarean delivery after a failed vaginal vacuum attempt for abnormal fetal heart tracing in the second stage of labor.  She was consented for the procedure after explaining risks benefits and alternatives of the procedure including risks of bleeding, infection and damage to organs.    PROCEDURE:   Informed consent was obtained from the patient to undergo the procedure. She was taken to the operating room where her anesthesia was found to be adequate. She was prepped and draped in the usual sterile fashion and a Foley catheter was placed. She received 2 g of IV Ancef preoperatively.  Fetal heart rate after entering the OR had been found to be in the 90s. We could not wait for the hibiclens to dry therefore betadine was poured over her abdomen and wiped off.  A Pfannenstiel incision was made with the scalpel and the incision extended through the subcutaneous  layer and also the fascia with the bovie. Small perforators in the subcutaneous layer were contained with the Bovie. The fascia was nicked in the midline and then was further separated from the rectus muscles bilaterally using Mayo scissors. Kochers were placed inferiorly and then superiorly to allow further separation of fascia from the rectus muscles.  The peritoneal cavity was entered bluntly with the fingers. The Alexis retractor was placed in. The bladder flap was created using Metzenbaum scissors.  The uterus was incised with a scalpel and the incision extended bilaterally with bandage scissors.  Thick meconium stained fluid was encountered.  The head, then the rest of the body was then delivered with abdominal pressure.  She delivered a viable female infant.  Delayed cord clamping was not done.  Cord pH and blood were collected.    The uterus was not exteriorized.  The edges of the uterus was grasped with T. Clamps. The placenta was delivered with gentle traction on the umbilical cord. The uterus was cleared of clots and debris with a lap.  The incision uterine incision was closed with #1 Vicryl in a running locked stitch. An imbricating layer of same stitch was placed over the initial closure.  Irrigation was applied and suctioned out. Excellent hemostasis was noted over the incision.  The muscles and peritoneum were then reapproximated using chromic suture.  Fascia was closed using 0 Vicryl in a running stitch. The subcutaneous layer was irrigated and suctioned out. Small perforators were contained with the bovie.  The subcutaneous layer was closed  over using 1-0 plain in interrupted stitches. The skin was closed using 4-0 Monocryl. Dermabond was applied. Honeycomb was then applied. The patient was then cleaned and she was taken to the recovery room in stable condition with her baby.    SPECIMEN: Placenta to pathology, umbilical cord pH and blood  DISPOSITION: TO PACU, STABLE.   ATTENDING  ATTESTATION: I was present and scrubbed and performed the procedure and the assistant was required due to the complexity of anatomy.   Waymon Amato, MD. 07/12/2021 02:27 AM.

## 2021-07-12 NOTE — Progress Notes (Addendum)
Subjective:    Pt pushing. Remote from delivery. Fetal tacycardia, recurrent lates, variables with pushing. Dr Alesia Richards called. Update given, on her way to bedside. Discussed R/B/A of vacuum delivery with pt and husband. Discussed primary cesarean. Pt and husband refuse cesarean at present and request vacuum attempt.   Objective:    VS: BP 100/60   Pulse 86   Temp 99.4 F (37.4 C) (Axillary)   Resp 15   Ht 4\' 11"  (1.499 m)   Wt 75.1 kg   LMP 10/03/2020   SpO2 97%   BMI 33.45 kg/m  FHR : baseline 180 / variability minimal / accelerations absent / variable, late decelerations Toco: contractions every 2-3 minutes /  Membranes: SROM on 07/11/21 @ 1015 Dilation: 10 Dilation Complete Date: 07/11/21 Dilation Complete Time: 2300 Effacement (%): 100 Cervical Position: Anterior Station: 0 Presentation: Vertex Exam by:: Dr. Alesia Richards Pitocin 3 mU/min  Assessment/Plan:   35 y.o. G1P0 103w2d IOL for single umbilical artery, SGA @ 91%  Labor:  pushing, not effectively. Remote for delivery. Dr Alesia Richards in route.  Chorioamnionitis: Tylenol 1000mg , Unasyn 3gms Preeclampsia:  no signs or symptoms of toxicity Fetal Wellbeing:  Category II Pain Control:  Epidural I/D:   GBS negative Anticipated MOD:   Cesarean delivery Dr Alesia Richards in route  Domingo Pulse MSN, CNM 07/12/2021 12:40 AM

## 2021-07-12 NOTE — Lactation Note (Signed)
This note was copied from a baby's chart. Lactation Consultation Note  Patient Name: Krystal Cook Date: 07/12/2021 Reason for consult: Follow-up assessment;1st time breastfeeding;Primapara;Term Age:35 hours  Mom requested LC assistance as baby awake and cueing.  Baby attempted in laid back cross cradle hold and then had more success with football hold on right breast.  Baby assisted to latch several times, sucked a couple times and then popped off.  Mom tends to pull breast away from baby's nose.  LC hand expressed colostrum and baby opened her mouth wide and latched deeply onto breast.  Baby noted to suck with deep jaw extensions.  No swallows identified.   Baby left in football hold latched on with a wide gape of her mouth.  Baby relaxed and Mom ok with baby remaining in place.  Encouraged Mom to keep baby STS as much as possible  LATCH Score Latch: Repeated attempts needed to sustain latch, nipple held in mouth throughout feeding, stimulation needed to elicit sucking reflex.  Audible Swallowing: None  Type of Nipple: Everted at rest and after stimulation (short nipples and compressible areola)  Comfort (Breast/Nipple): Soft / non-tender  Hold (Positioning): Assistance needed to correctly position infant at breast and maintain latch.  LATCH Score: 6   Interventions Interventions: Breast feeding basics reviewed;Assisted with latch;Skin to skin;Breast massage;Hand express;Adjust position;Support pillows;Position options   Consult Status Consult Status: Follow-up Date: 07/13/21 Follow-up type: In-patient    Broadus John 07/12/2021, 3:06 PM

## 2021-07-13 NOTE — Lactation Note (Signed)
This note was copied from a baby's chart. Lactation Consultation Note  Patient Name: Krystal Cook [id number] Date: 07/13/2021 Reason for consult: Follow-up assessment;Infant weight loss;1st time breastfeeding;Primapara;Term Age:35 hours  LC in to visit with P46 Mom of term baby.  Baby at 8.2% weight loss with adequate output.    Mom reports a sleepy baby that latches to breast, but falls asleep. Mom slept a bit this afternoon and baby went 5 hrs without eating.  Baby just fed 25 ml formula by bottle.    Mom to ask for help at next feeding.  Parents encouraged to awaken baby at 3 hrs or feed baby earlier if baby shows feeding cues.  Plan recommended- 1-Keep baby STS as much as possible 2- Watch baby for feeding cues and offer breast.  Ask for help with latching baby deeply onto breast. 3-Hand express and do breast massage. 4- If baby sleepy or unable to latch to breast, supplement baby with formula+/EBM by bottle, volume to increase to 20-30 ml 5- Mom to double pump on initiation setting.  Mom states she has a Medela DEBP at home.  Talked about OP lactation F/U after discharge.    Lactation Tools Discussed/Used Tools: Pump;Flanges;Bottle Flange Size: 24 (mom had been given 30 mm flanges.  Jeff resized them to 24) Breast pump type: Double-Electric Breast Pump Pump Education: Setup, frequency, and cleaning;Milk Storage Reason for Pumping: Support milk supply/sleepy baby at the breast Pumping frequency: Encouraged to pump after breastfeeding attempts Pumped volume: 1 mL (drops)  Interventions Interventions: Breast feeding basics reviewed;Skin to skin;Breast massage;Hand express;DEBP  Discharge Pump: Personal (Medela DEBP)  Consult Status Consult Status: Follow-up Date: 07/14/21 Follow-up type: In-patient    Krystal Cook 07/13/2021, 5:21 PM

## 2021-07-13 NOTE — Progress Notes (Signed)
Subjective: POD# 1 Information for the patient's newborn:  Tomi, Grandpre Girl Denissa [[id number]]  female   30 Name Alinda Sierras Circumcision N/A  Reports feeling "OK, but sore". Wrist are sore from carpel tunnel x4 weeks and pain worsened during second stage of labor.  Feeding: breast Reports tolerating PO and denies N/V, foley removed, ambulating and urinating w/o difficulty  Pain is well-controlled with  PO meds Denies HA/SOB/dizziness  Flatus passing Vaginal bleeding is normal, no clots     Objective:  VS:  Vitals:   07/12/21 1100 07/12/21 1545 07/12/21 2022 07/13/21 0538  BP:  102/70 (!) 95/53 102/63  Pulse:  74 77 75  Resp: 17 18 18 18   Temp: 98.2 F (36.8 C) 98.2 F (36.8 C) 98.2 F (36.8 C) 97.7 F (36.5 C)  TempSrc: Oral Oral Oral Oral  SpO2: 99% 100% 100%   Weight:      Height:        Intake/Output Summary (Last 24 hours) at 07/13/2021 0902 Last data filed at 07/13/2021 0200 Gross per 24 hour  Intake --  Output 2250 ml  Net -2250 ml     Recent Labs    07/10/21 1345 07/12/21 0549  WBC 8.6 19.4*  HGB 12.2 11.8*  HCT 34.6* 33.7*  PLT 341 243    Blood type: --/--/A POS (10/05 1345) Rubella: Immune (03/02 0000)    Physical Exam:  General: alert, cooperative, and no distress CV: Regular rate and rhythm or without murmur or extra heart sounds Resp: clear and unlabored Abdomen: soft, nontender, normal bowel sounds Incision:  honeycomb dressing is clean, dry, and intact Perineum:  Uterine Fundus: firm, below umbilicus, nontender Lochia:  appropriate Ext:  +1 pitting pedal edema, neg for pain, tenderness, or cords   Assessment/Plan: 35 y.o.   POD# 1. G1P0                  Principal Problem:   Status post primary low transverse cesarean section Active Problems:   AMA (advanced maternal age) multigravida 50+   Single umbilical artery   Routine post-op PP care          Advance diet as tolerated Advised warm fluids and ambulation to improve GI  motility Breastfeeding support Anticipate D/C 07/14/21   Arrie Eastern, MSN, CNM 07/13/2021, 9:02 AM

## 2021-07-13 NOTE — Lactation Note (Signed)
This note was copied from a baby's chart. Lactation attempted to consult with Mom.  Mom sleeping with eye mask on and sign on door.  Will f/u later

## 2021-07-14 ENCOUNTER — Encounter (HOSPITAL_COMMUNITY): Payer: Self-pay | Admitting: Obstetrics & Gynecology

## 2021-07-14 MED ORDER — DOCUSATE SODIUM 100 MG PO CAPS: 100.0000 mg | ORAL_CAPSULE | Freq: Every day | ORAL | 0 refills | Status: AC | PRN

## 2021-07-14 MED ORDER — IBUPROFEN 600 MG PO TABS: 600.0000 mg | ORAL_TABLET | Freq: Four times a day (QID) | ORAL | 0 refills | Status: AC

## 2021-07-14 MED ORDER — OXYCODONE HCL 5 MG PO TABS: 5.0000 mg | ORAL_TABLET | ORAL | 0 refills | Status: AC | PRN

## 2021-07-14 MED ORDER — SIMETHICONE 80 MG PO CHEW: 80.0000 mg | CHEWABLE_TABLET | ORAL | 0 refills | Status: AC | PRN

## 2021-07-14 NOTE — Lactation Note (Signed)
This note was copied from a baby's chart. Lactation Consultation Note  Patient Name: Krystal Cook [id number] Date: 07/14/2021 Reason for consult: Follow-up assessment;Mother's request;Difficult latch;1st time breastfeeding;Term;Infant weight loss (Infant with 8% weight loss, hx of not sustaining latch and poor feeder.) Age:35 hours Per mom, infant previously not latch well at breast and has been spitty, earlier tonight infant breastfeed few minutes. Mom attempted to latch infant a few times but infant was not sustaining latch, LC observed mom is short shafted, mom latched infant with 20 mm NS, infant sustained latch and breastfeed for 10 minutes without coming off the breast. LC encouraged mom to do breast stimulation techniques to keep infant awake such as gently stroking infant's neck and shoulder, breast compressions and talking to infant while nursing. Mom has been feeding infant swaddled in clothing and blankets not skin to skin, LC discussed importance of skin to skin while breastfeeding. Due family religious beliefs infant was switch to Haal formula ( Similac total comfort 20 kcal with iron) infant given extra slow flow ( green nipple) appeared to drink better with this nipple according to parents, infant has consumed 7 mls as LC left the room and was still being bottle fed by dad.  Mom's plan: 1- Mom will continue to breastfeed infant according to feeding cues, 8 to 12+ or more times within 24 hours, skin to skin. 2- Parents understand due infant weight loss to continue to supplement infant with any EBM that mom pumped first and then formula, Day 3  after latching infant at breast to offer ( 18 -25 mls) or as much as infant wants. 3-Mom will continue to DEBP every 3 hours for 15 minutes on initial setting. 4-Mom knows to call RN/LC if she need further latch assistance or help with using 20 mm NS.  Maternal Data    Feeding Mother's Current Feeding Choice: Breast Milk  LATCH  Score Latch: Grasps breast easily, tongue down, lips flanged, rhythmical sucking.  Audible Swallowing: A few with stimulation  Type of Nipple: Everted at rest and after stimulation (short shafted.)  Comfort (Breast/Nipple): Soft / non-tender  Hold (Positioning): Assistance needed to correctly position infant at breast and maintain latch.  LATCH Score: 8   Lactation Tools Discussed/Used Tools: Nipple Shields Nipple shield size: 20 (Mom is short shafted and infant sustained latch with 20 mm NS, colostrum was present in NS when infant came off the breast.)  Interventions Interventions: Skin to skin;Assisted with latch;Breast compression;Adjust position;Support pillows;Position options;Expressed milk;Education;Pace feeding;DEBP  Discharge    Consult Status Consult Status: Follow-up Date: 07/15/21 Follow-up type: In-patient    Vicente Serene 07/14/2021, 1:29 AM

## 2021-07-14 NOTE — Discharge Summary (Signed)
PCS OB Discharge Summary     Patient Name: Aniesa Boback DOB: 02-07-1986 MRN: [id number]  Date of admission: 07/10/2021 Delivering MD: Waymon Amato  Date of delivery: 10/7 Type of delivery: PCS  Newborn Data: Sex: Baby female Live born female  Birth Weight: 6 lb 7.9 oz (2946 g) APGAR: 84, 8  Newborn Delivery   Birth date/time: 07/12/2021 01:07:00 Delivery type: C-Section, Low Transverse Trial of labor: Yes C-section categorization: Primary      Feeding: breast Infant being discharge to home with mother in stable condition.   Admitting diagnosis: AMA (advanced maternal age) multigravida 67+ [D17.616] Single umbilical artery [W73.7] Intrauterine pregnancy: [redacted]w[redacted]d     Secondary diagnosis:  Principal Problem:   Status post primary low transverse cesarean section Active Problems:   AMA (advanced maternal age) multigravida 10+   Single umbilical artery   Normal postpartum course                                Complications: Intrauterine Inflammation or infection (Chorioamniotis)                                                              Intrapartum Procedures: cesarean: low cervical, transverse Postpartum Procedures: antibiotics Complications-Operative and Postpartum: none Augmentation: AROM, Pitocin, and Cytotec   History of Present Illness: Ms. Scottlynn Lindell is a 35 y.o. female, G1P1001, who presents at [redacted]w[redacted]d weeks gestation. The patient has been followed at  [facility]  Her pregnancy has been complicated by:  Patient Active Problem List   Diagnosis Date Noted   Normal postpartum course 07/14/2021   Status post primary low transverse cesarean section 07/12/2021   AMA (advanced maternal age) multigravida 35+ 62/69/4854   Single umbilical artery 62/70/3500   Vitamin D deficiency 06/05/2017   Thrombocytosis 06/05/2017   Hyperlipidemia 06/04/2017   Grief reaction 06/04/2017   Family history of diabetes mellitus in first degree relative  06/04/2017     Active Ambulatory Problems    Diagnosis Date Noted   Hyperlipidemia 06/04/2017   Grief reaction 06/04/2017   Family history of diabetes mellitus in first degree relative 06/04/2017   Vitamin D deficiency 06/05/2017   Thrombocytosis 06/05/2017   Resolved Ambulatory Problems    Diagnosis Date Noted   No Resolved Ambulatory Problems   Past Medical History:  Diagnosis Date   Hyperlipemia      Hospital course:  Induction of Labor With Cesarean Section   35 y.o. yo G1P1001 at [redacted]w[redacted]d was admitted to the hospital 07/10/2021 for induction of labor. Patient had a labor course significant for was admitted on 10/5 for IOL forAbeer Mcenery is a 35 y.o. female G1P0 SIUP at 63 weeks presents for scheduled induction of labor for 2 vessel umbilical cord (single umbilical artery), history of IUGR, now small for gestational age (EFW 16%), progressed with cytotec, AROM and pitocin, heavy meconium noted, cat 2 strip noted and pt was offered PCS versus vacuum, FOB declined PCS atr that time, vacuum performed and failed, chorio suspected abx started, proceeded with PCS with Dr Alesia Richards on 10/7 for NRFHT, ebl was 551mls, geb drop of 12.2-11.8, asymptomatic. She was taken to the operating room, where Dr. Alesia Richards performed a primary LTCS under  epidural anesthesia, with delivery of a viable baby female, with weight and Apgars as listed below. Infant was in good condition and remained at the patient's bedside.  The patient was taken to recovery in good condition.  Patient planned to breast feed.  On post-op day 1, patient was doing well, tolerating a regular diet, with Hgb of 11.8.  Throughout her stay, her physical exam was WNL, her incision was CDI, and her vital signs remained stable.  By post-op day 1, she was up ad lib, tolerating a regular diet, with good pain control with po med.  She was deemed to have received the full benefit of her hospital stay, and was discharged home in stable condition.   Contraceptive choice was undecided. Discussed her carpel tunnel syndrome and to wear splits for next couple weeks, discuss honeycomb dressing and to remove it 10/12, pt talked about pain below the incision I am under the impression this is referred incision pain, no redness, no draining, CDI, abdominal binder ordered. Pt also discussed left shoulder pian and under the impression this is gas pain from surgery. Multiple concerns addressed, spent 45 mins in pt room. Pt relations was called and will visit pt room per request of the pt. Pt was teary eyed in describing her birth experience. Pt discussed she felt she was not heard, felt she was not informed of her care, her religion was not respected, was upset that anesthesia was a female provider. Pt endorses waking frequent with nightmares and startle reflex at night, pt desires to be discharged to her home today, Discusses risk of PPD with birth experience and reccommended start zoloft for prophylactic, pt declined, recommended pt be seen for mental mood check in 2 week, pt informed she may switch providers or companies, but stressed the importance of being seen by someone. Pt verbalized agreement. Patient relations will see pt prior to her discharge today.     The patient went for cesarean section due to Non-Reassuring FHR. Delivery details are as follows: Membrane Rupture Time/Date: 1:06 AM ,07/12/2021   Delivery Method:C-Section, Low Transverse  Details of operation can be found in separate operative Note.  Patient had an uncomplicated postpartum course. She is ambulating, tolerating a regular diet, passing flatus, and urinating well.  Patient is discharged home in stable condition on 07/14/21.      Newborn Data: Birth date:07/12/2021  Birth time:1:07 AM  Gender:Female  Living status:Living  Apgars:5 ,8  Weight:2946 g                                Physical exam  Vitals:   07/13/21 0538 07/13/21 1331 07/13/21 2013 07/14/21 0500  BP: 102/63 109/67  105/75 113/71  Pulse: 75 81 75 89  Resp: 18 18 16 18   Temp: 97.7 F (36.5 C) 98 F (36.7 C) 98.2 F (36.8 C) 98.5 F (36.9 C)  TempSrc: Oral Oral Oral Oral  SpO2:      Weight:      Height:       General: alert, cooperative, and no distress Lochia: appropriate Uterine Fundus: firm Incision: Healing well with no significant drainage, No significant erythema, Dressing is clean, dry, and intact, honeycomb dressing CDI Perineum: Intact DVT Evaluation: No evidence of DVT seen on physical exam. Negative Homan's sign. No cords or calf tenderness. 1+ lower extremity and petal edema noted.   Labs: Lab Results  Component Value Date   WBC 19.4 (H)  07/12/2021   HGB 11.8 (L) 07/12/2021   HCT 33.7 (L) 07/12/2021   MCV 88.0 07/12/2021   PLT 243 07/12/2021   CMP Latest Ref Rng & Units 01/15/2018  Glucose 65 - 99 mg/dL 78  BUN 6 - 20 mg/dL 14  Creatinine 0.57 - 1.00 mg/dL 0.51(L)  Sodium 134 - 144 mmol/L 138  Potassium 3.5 - 5.2 mmol/L 4.6  Chloride 96 - 106 mmol/L 100  CO2 20 - 29 mmol/L 26  Calcium 8.7 - 10.2 mg/dL 9.9  Total Protein 6.0 - 8.5 g/dL 7.1  Total Bilirubin 0.0 - 1.2 mg/dL <0.2  Alkaline Phos 39 - 117 IU/L 100  AST 0 - 40 IU/L 25  ALT 0 - 32 IU/L 14    Date of discharge: 07/14/2021 Discharge Diagnoses: Term Pregnancy-delivered Discharge instruction: per After Visit Summary and "Baby and Me Booklet".  After visit meds:   Activity:           unrestricted and pelvic rest Advance as tolerated. Pelvic rest for 6 weeks.  Diet:                routine Medications: PNV, Ibuprofen, Colace, Iron, and oxy IR Postpartum contraception: Undecided Condition:  Pt discharge to home with baby in stable and conditon   Meds: Allergies as of 07/14/2021       Reactions   Avocado Anaphylaxis, Nausea And Vomiting   Kiwi Extract Anaphylaxis   Pineapple Anaphylaxis        Medication List     STOP taking these medications    benzonatate 200 MG capsule Commonly known as:  TESSALON   glycopyrrolate 2 MG tablet Commonly known as: ROBINUL   ondansetron 4 MG disintegrating tablet Commonly known as: ZOFRAN-ODT   predniSONE 10 MG tablet Commonly known as: DELTASONE       TAKE these medications    docusate sodium 100 MG capsule Commonly known as: Colace Take 1 capsule (100 mg total) by mouth daily as needed.   ferrous sulfate 325 (65 FE) MG tablet Take 325 mg by mouth daily with breakfast.   ibuprofen 600 MG tablet Commonly known as: ADVIL Take 1 tablet (600 mg total) by mouth every 6 (six) hours.   Multivital Chew Chew 2 tablets by mouth daily.   mupirocin ointment 2 % Commonly known as: Bactroban Place 1 application into the nose 2 (two) times daily.   oxyCODONE 5 MG immediate release tablet Commonly known as: Oxy IR/ROXICODONE Take 1 tablet (5 mg total) by mouth every 4 (four) hours as needed for moderate pain.   simethicone 80 MG chewable tablet Commonly known as: MYLICON Chew 1 tablet (80 mg total) by mouth as needed for flatulence.               Discharge Care Instructions  (From admission, onward)           Start     Ordered   07/14/21 0000  Discharge wound care:       Comments: Take dressing off on day 5-7 postpartum.  Report increased drainage, redness or warmth. Clean with water, let soap trickle down body. Can leave steri strips on until they fall off or take them off gently at day 10. Keep open to air, clean and dry.   07/14/21 1145            Discharge Follow Up:   Follow-up [facility]. Schedule an appointment as soon as possible for a  visit in 2 week(s).   Specialty: Obstetrics and Gynecology Why: 2 week mood check, and 6 weeks PPV, can make 2 week mood check virtual if needed Contact information: [address] [place] [id number] [place], NP-C, CNM 07/14/2021, 11:45 AM  Noralyn Pick, [place]

## 2021-07-14 NOTE — Lactation Note (Signed)
This note was copied from a baby's chart. Lactation Consultation Note  Patient Name: Krystal Cook [id number] Date: 07/14/2021 Reason for consult: Follow-up assessment;Mother's request Age:35 hours  [place] worked with Mom getting a deeper latch with audible swallows. Infant did better in prone position with use of 20 NS.   Mom denied any pain with the latch. Her nipples were round with no signs of trauma.  LC reviewed supplementation volume given infant 84 hrs old. Parents to increase offering EBM first followed by formula after latching.  Mom aware to use DEBP q 3hrs for 15 min to maintain her milk supply.  All questions answered at the end of the visit.    Maternal Data    Feeding Mother's Current Feeding Choice: Breast Milk and Formula Nipple Type: Slow - flow  LATCH Score Latch: Repeated attempts needed to sustain latch, nipple held in mouth throughout feeding, stimulation needed to elicit sucking reflex.  Audible Swallowing: Spontaneous and intermittent  Type of Nipple: Everted at rest and after stimulation  Comfort (Breast/Nipple): Soft / non-tender  Hold (Positioning): Assistance needed to correctly position infant at breast and maintain latch.  LATCH Score: 8   Lactation Tools Discussed/Used Tools: Nipple Shields Nipple shield size: 20 Flange Size: 24 Breast pump type: Double-Electric Breast Pump Pump Education: Setup, frequency, and cleaning;Milk Storage Reason for Pumping: increase stimulation Pumping frequency: every 3 hrs for 76min  Interventions Interventions: Breast feeding basics reviewed;Support pillows;Education;Assisted with latch;Position options;Pace feeding;Skin to skin;Expressed milk;Breast massage;Infant Driven Feeding Algorithm education;DEBP;Breast compression;Adjust position  Discharge Discharge Education: Engorgement and breast care;Warning signs for feeding baby;Outpatient recommendation;Outpatient Epic message sent Pump: Personal WIC  Program: No  Consult Status Consult Status: Complete Date: 07/15/21    [name]  Nicholson-Springer 07/14/2021, 3:00 PM

## 2021-07-15 LAB — SURGICAL PATHOLOGY

## 2021-07-15 NOTE — Progress Notes (Signed)
I was called to room to hear complaints concerning Ms Klich's care at the delivery of her daughter.  The patient requested her husband to speak with me as because her english is not as good as his.  The patient's husband said they were both grateful their daughter is healthy and his wife is healthy but they felt the staff was not sensitive to their needs and did not follow the birth plan. Since the delivery, the patient said she was having difficulty sleeping and cried when she thought about the experience. The patient prefers female providers. They were warned that the anesthesiologist was female and they accepted that.  When the NICU team was called for the delivery, they were not told there would be a "large group" of people rushing into the room including another female and the patient was completely naked with her legs in stirrups.   She was tearful as she and her husband talked with me.  She again had the same experience when she was rushed to the OR for a CS delivery.  After the prep, when she was completely naked, the anesthesiologist entered.  She asked the nurses to please cover her.   They had other complaints concerning insensitivity for her and her husband. They both said they wanted to make sure this same experience would not happen to another woman. I listened and offered an apology. I took notes and told them, I would share their experience with the leadership of the department.  I also gave them a Patient Experience card. A email was sent to the leadership team with the many details of their complaints.

## 2021-07-23 ENCOUNTER — Telehealth (HOSPITAL_COMMUNITY): Payer: Self-pay

## 2021-07-23 NOTE — Telephone Encounter (Signed)
"  I'm doing good. I've had some difficulty with latching." RN talked with patient about [facility] outpatient [place] appointments, [place] phone line, and breastfeeding/pumping support group. Will e-mail patient these resources. Patient has no questions or concerns about her healing.  "She's doing well. Eating and gaining weight well." Pateint has no questions about baby.  Patient had to get off of the phone was unable to complete EPDS at this time.   Sharyn Lull [facility] 07/23/2021,1400

## 2022-02-12 IMAGING — US US MFM OB LIMITED
1 series · 15 of 28 positions shown · non-contrast
Comparison: none

[Series 1: us mfm ob limited · 15 of 42 slices shown]
[im 1/42]
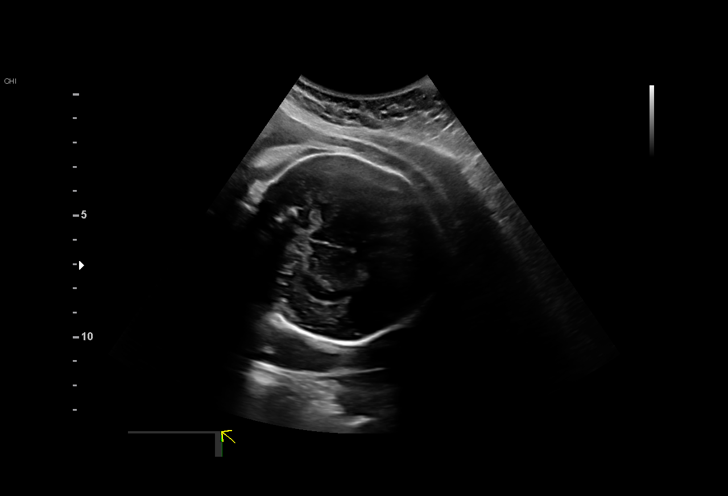
[im 4/42]
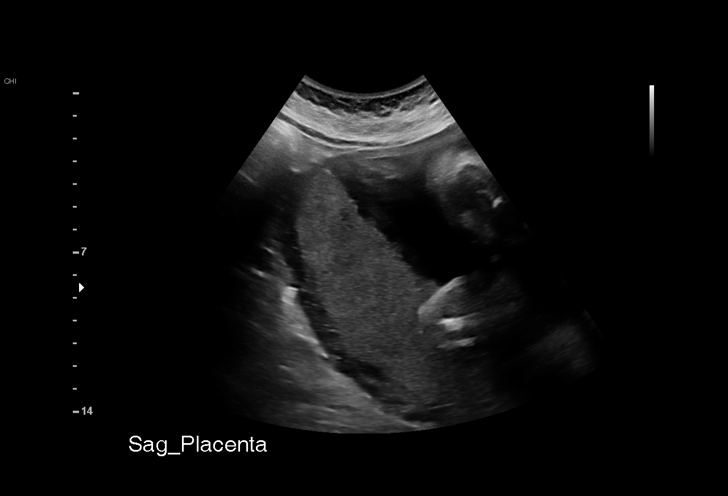
[im 7/42]
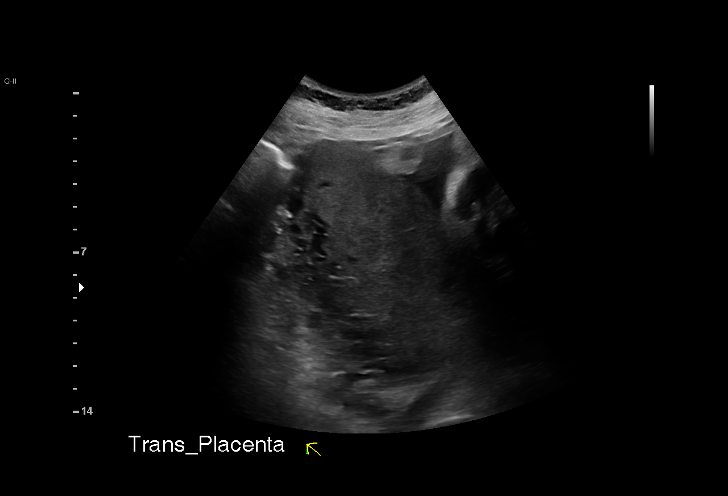
[im 10/42]
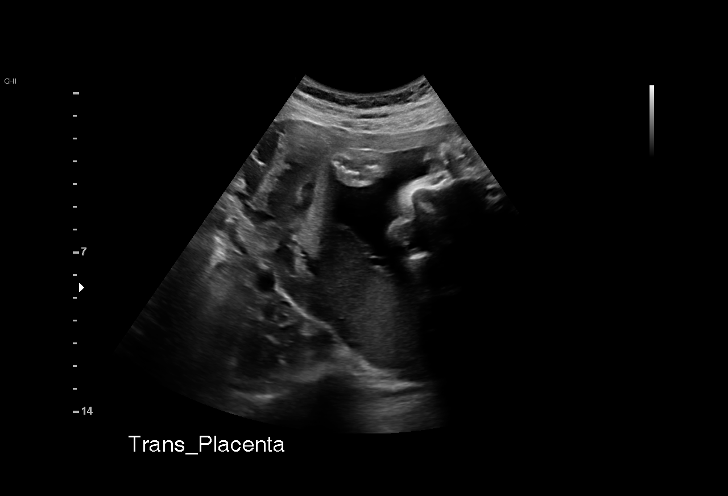
[im 13/42]
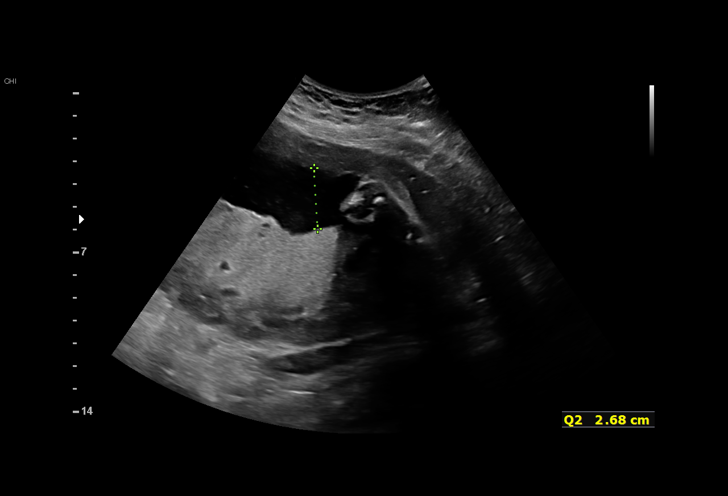
[im 16/42]
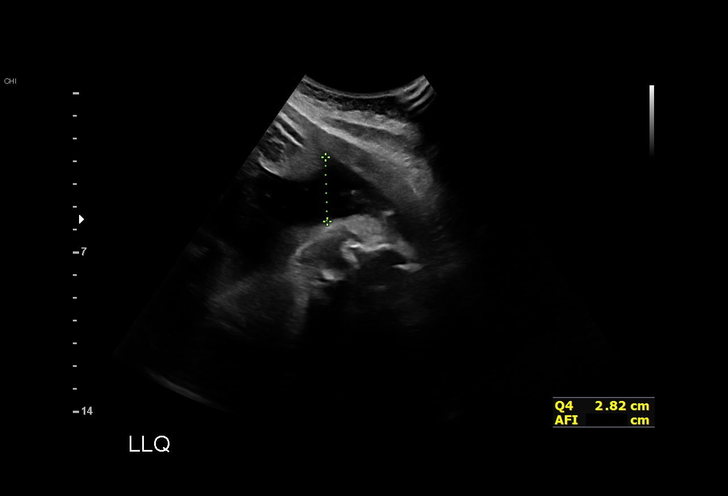
[im 19/42]
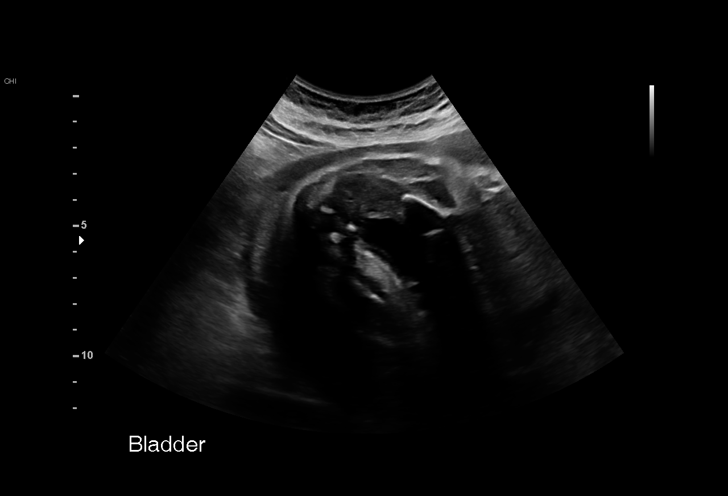
[im 22/42]
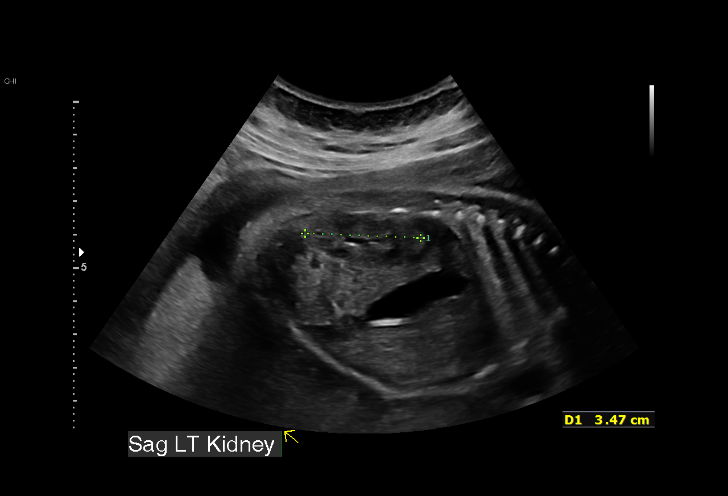
[im 23/42]
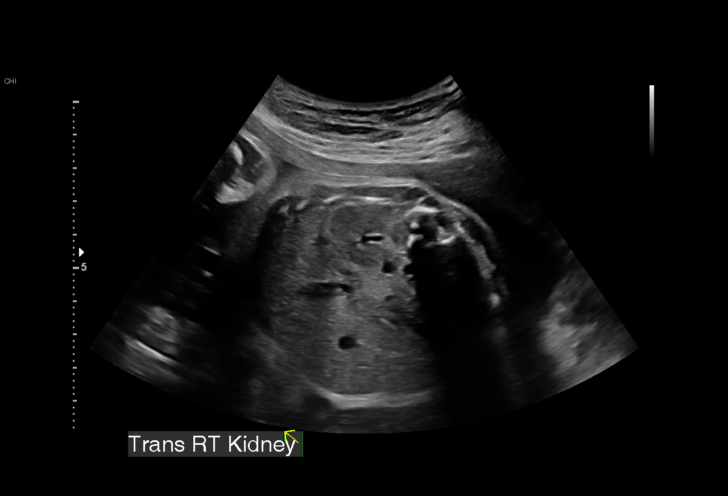
[im 26/42]
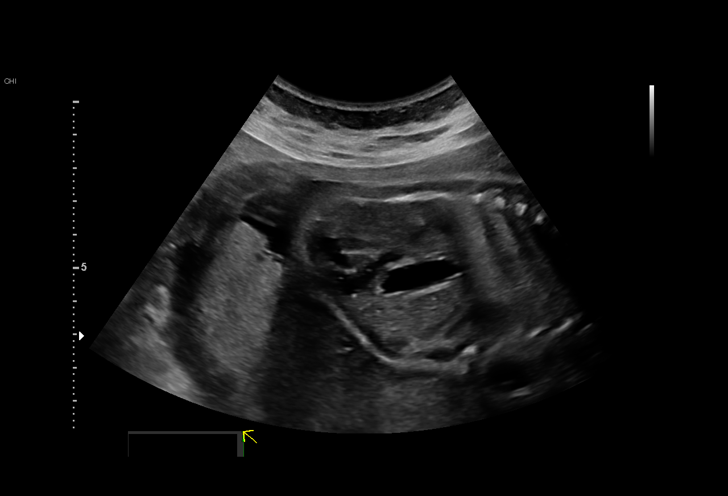
[im 29/42]
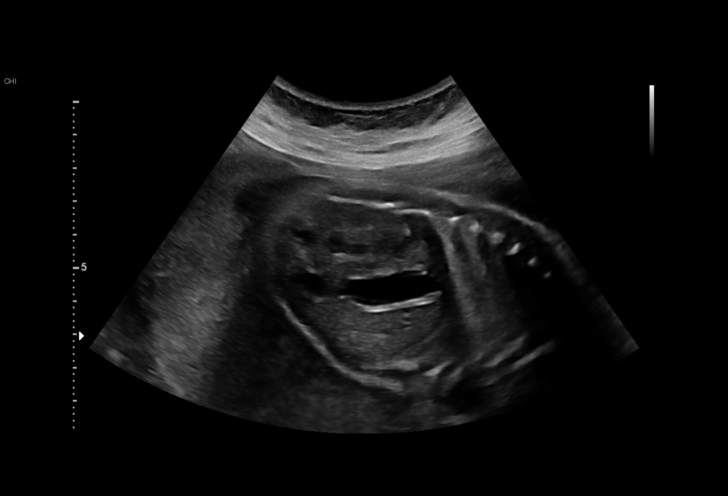
[im 32/42]
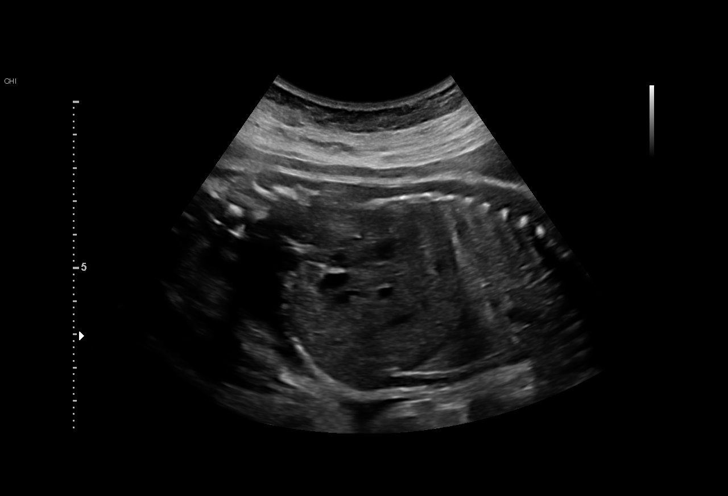
[im 35/42]
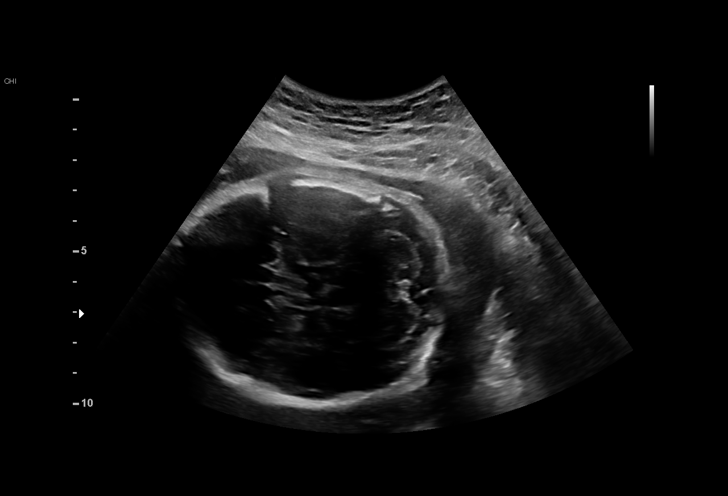
[im 38/42]
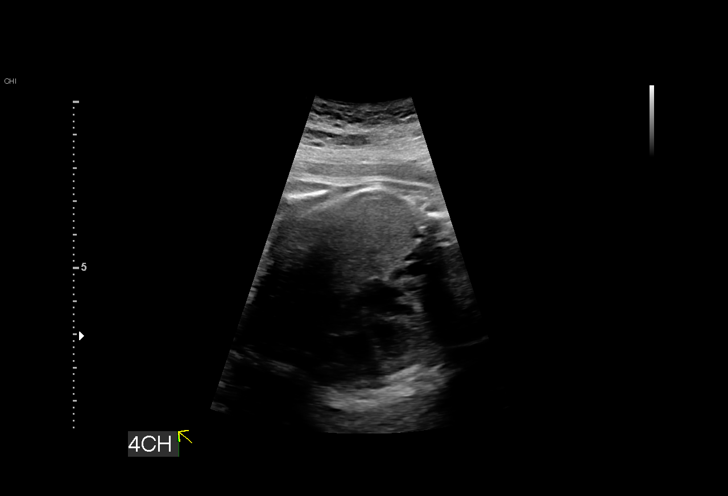
[im 42/42]
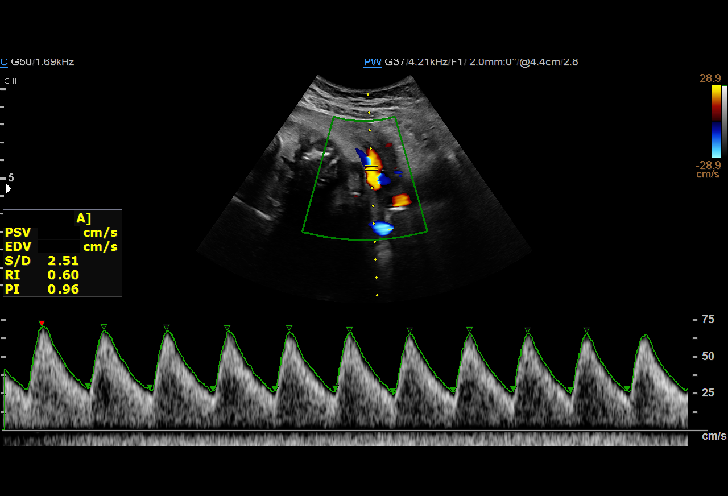

[15 of 28 positions shown; findings below may reference images not displayed]

[REDACTED]. [HOSPITAL]
                   DO

 1  US MFM OB LIMITED                     76815.01    [facility]
                                                      OPHEL[facility]
 2  US MFM UA CORD DOPPLER                76820.02    [facility]
                                                      OPHELIA

Indications

 Maternal care for known or suspected poor
 fetal growth, third trimester, not applicable or
 unspecified IUGR
 Abnormal fetal ultrasound (2 vessel umbilical
 cord)
 Advanced maternal age multigravida 35+,
 second trimester
 LOW risk NIPS
 31 weeks gestation of pregnancy
Fetal Evaluation

 Num Of Fetuses:         1
 Fetal Heart Rate(bpm):  164
 Cardiac Activity:       Observed
 Presentation:           Cephalic
 Placenta:               Posterior
 P. Cord Insertion:      Previously Visualized

 Amniotic Fluid
 AFI FV:      Within normal limits

 AFI Sum(cm)     %Tile       Largest Pocket(cm)
 11.84           30

 RUQ(cm)       RLQ(cm)       LUQ(cm)        LLQ(cm)

Biometry

 LV:        3.6  mm
OB History

 Gravidity:    1         Term:   0        Prem:   0        SAB:   0
 TOP:          0       Ectopic:  0        Living: 0
Gestational Age

 Best:          31w 1d     Det. By:  Early Ultrasound         EDD:   07/10/21
                                     (11/28/20)
Doppler - Fetal Vessels

 Umbilical Artery
  S/D     %tile      RI    %tile      PI    %tile            ADFV    RDFV
  2.63       43    0.62       46    0.86       35               No      No

Impression

 Patient with fetal growth restriction return for antenatal
 testing.  Single umbilical artery is present.

 Amniotic fluid is normal good fetal activity seen.  Umbilical
 artery Doppler showed normal forward diastolic flow.  NST is
 reactive.

 I reassured the patient of the findings
Recommendations

 - Fetal growth, BPP and UA Doppler next week.
                 Lipsey, Jamshid

## 2022-02-19 IMAGING — US US MFM UA CORD DOPPLER
1 series · 13 of 28 positions shown · non-contrast
Comparison: none

[Series 1: us mfm ua cord doppler · 49 acquisitions, 13 frames shown]
[im 2/49]
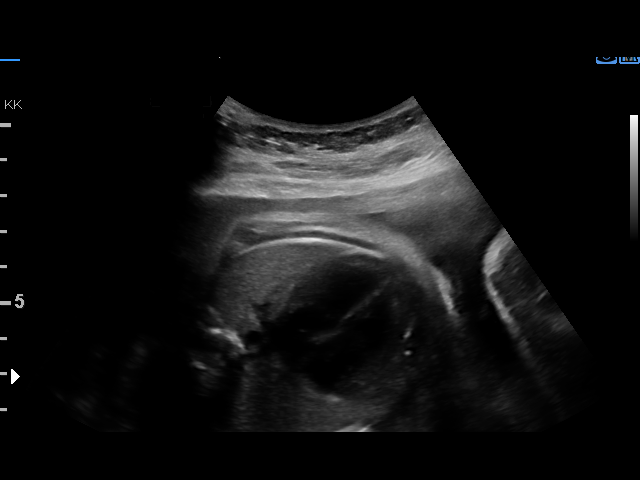
[im 6/49]
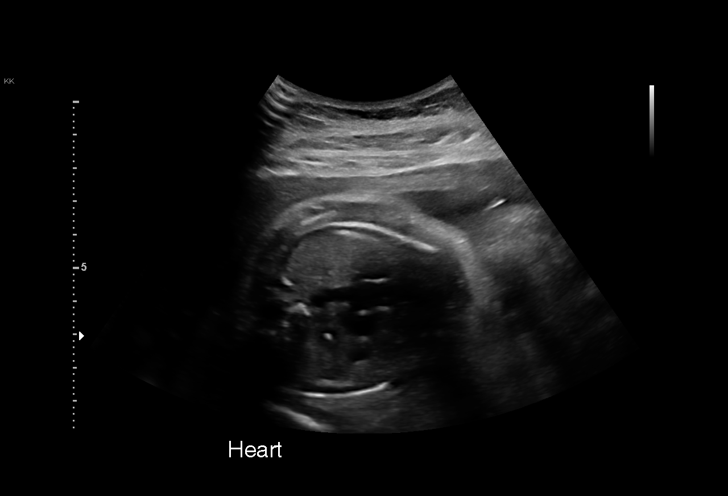
[im 9/49]
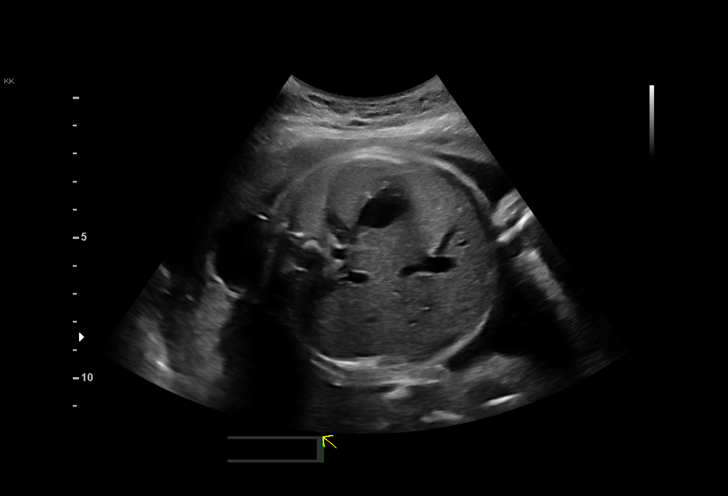
[im 13/49]
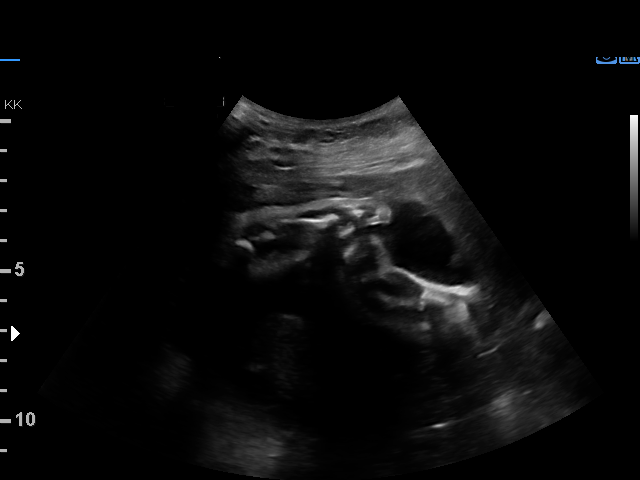
[im 17/49]
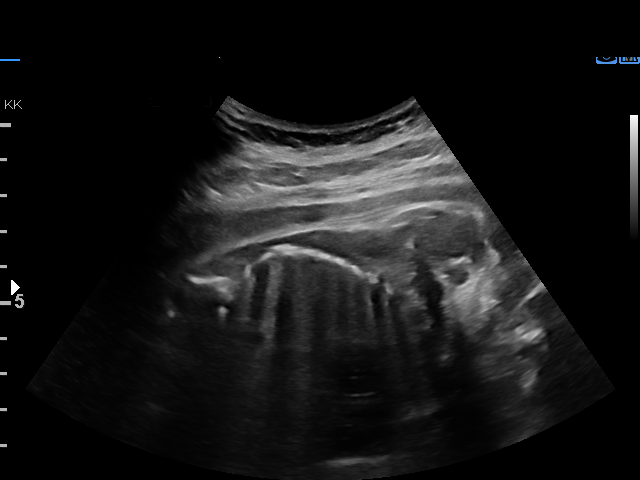
[im 20/49]
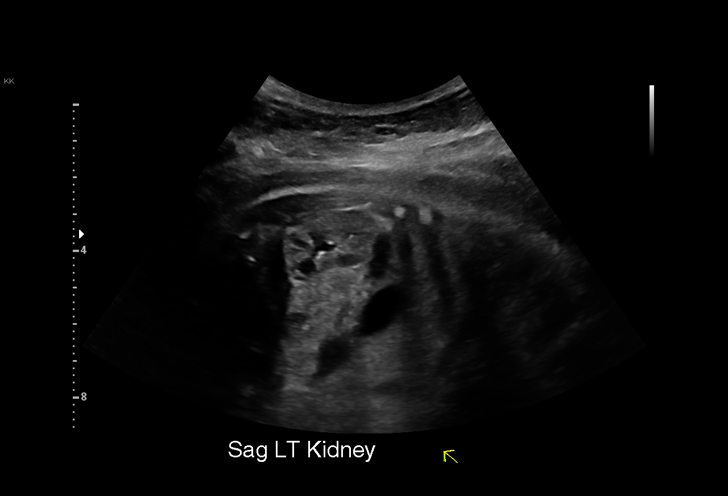
[im 25/49]
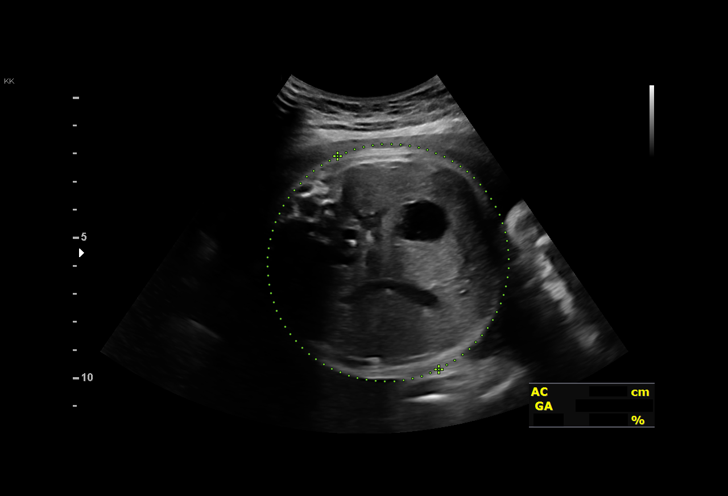
[im 29/49]
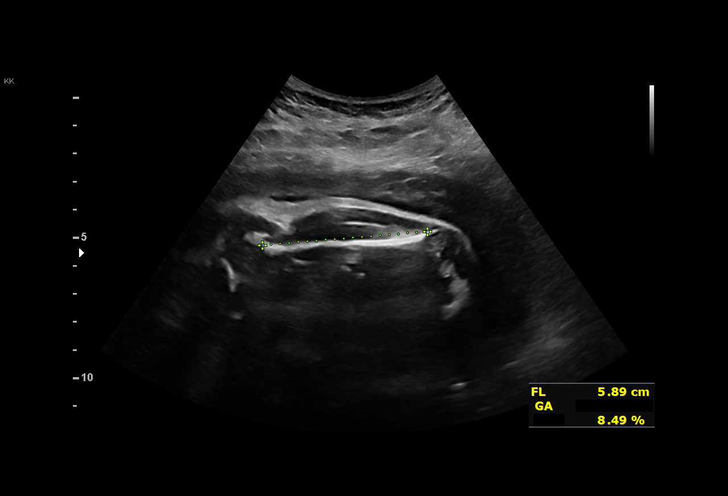
[im 33/49]
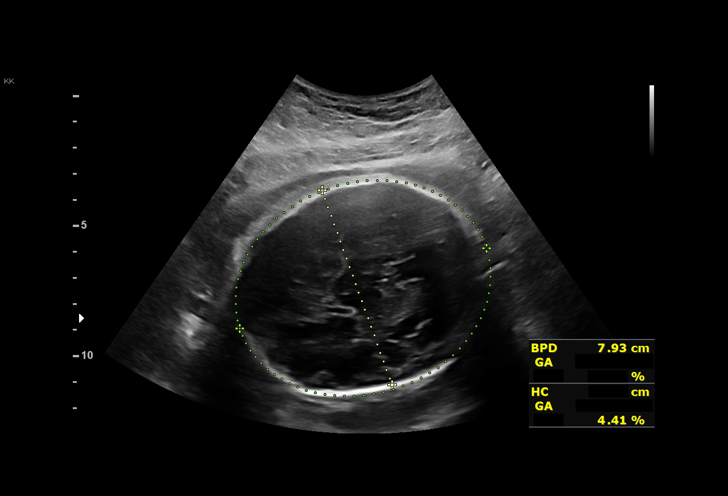
[im 36/49]
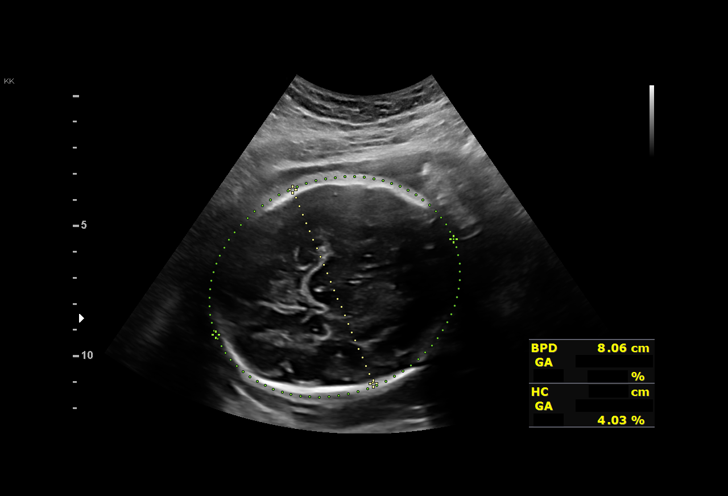
[im 40/49]
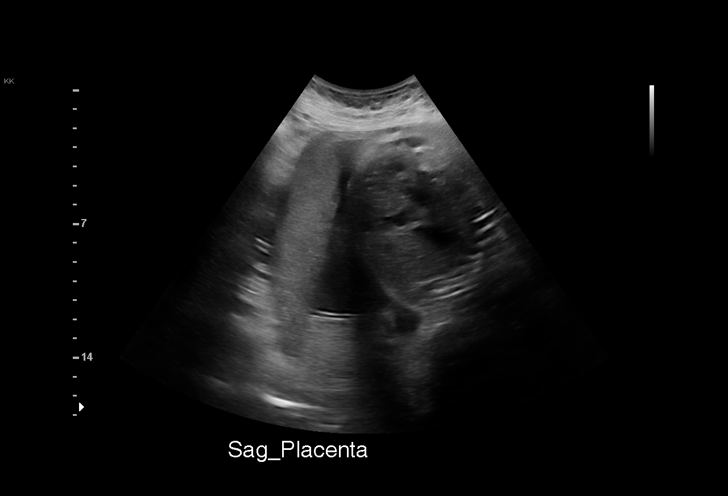
[im 43/49]
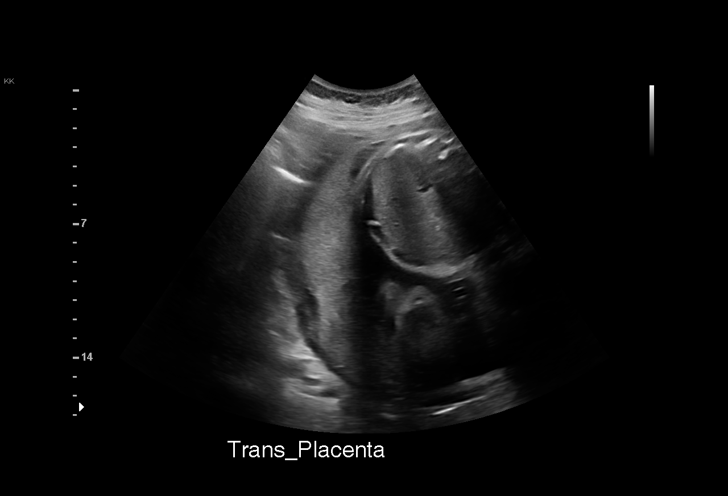
[im 47/49]
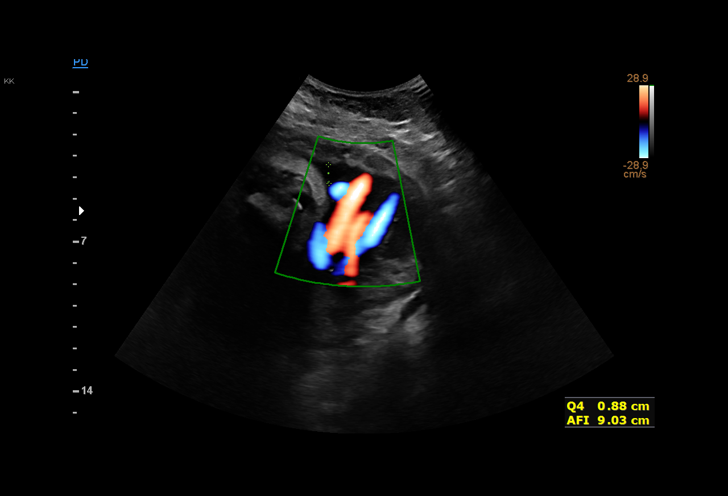

[13 of 28 positions shown; findings below may reference images not displayed]

[REDACTED]. [HOSPITAL]
                   DO

 1  US MFM UA CORD DOPPLER                76820.02    MONE
                                                      [place]
                                                      [place]

Indications

 32 weeks gestation of pregnancy
 Maternal care for known or suspected poor
 fetal growth, third trimester, not applicable or
 unspecified IUGR
 Abnormal fetal ultrasound (2 vessel umbilical
 cord)
 LOW risk NIPS (Normal ECHO)
 Advanced maternal age multigravida 35+,
 third trimester (35 yrs)
 Encounter for other antenatal screening
 follow-up
Fetal Evaluation

 Num Of Fetuses:         1
 Fetal Heart Rate(bpm):  135
 Cardiac Activity:       Observed
 Presentation:           Cephalic
 Placenta:               Posterior
 P. Cord Insertion:      Previously Visualized
 Amniotic Fluid
 AFI FV:      Within normal limits

 AFI Sum(cm)     %Tile       Largest Pocket(cm)
 9.03            9

 RUQ(cm)       RLQ(cm)       LUQ(cm)        LLQ(cm)

Biophysical Evaluation

 Amniotic F.V:   Pocket => 2 cm             F. Tone:        Observed
 F. Movement:    Observed                   Score:          [DATE]
 F. Breathing:   Observed
Biometry

 BPD:      79.9  mm     G. Age:  32w 1d         39  %    CI:        78.33   %    70 - 86
                                                         FL/HC:      20.7   %    19.1 -
 HC:      285.6  mm     G. Age:  31w 3d          5  %    HC/AC:      1.05        0.96 -
 AC:      271.3  mm     G. Age:  31w 2d         23  %    FL/BPD:     73.8   %    71 - 87
 FL:         59  mm     G. Age:  30w 5d          9  %    FL/AC:      21.7   %    20 - 24

 Est. FW:    8287  gm    3 lb 12 oz      14  %
OB History

 Gravidity:    1         Term:   0        Prem:   0        SAB:   0
 TOP:          0       Ectopic:  0        Living: 0
Gestational Age

 U/S Today:     31w 3d                                        EDD:   07/15/21
 Best:          32w 1d     Det. By:  Early Ultrasound         EDD:   07/10/21
                                     (11/28/20)
Anatomy

 Cranium:               Appears normal         LVOT:                   Previously seen
 Cavum:                 Appears normal         Aortic Arch:            Previously seen
 Ventricles:            Appears normal         Ductal Arch:            Previously seen
 Choroid Plexus:        Previously seen        Diaphragm:              Previously seen
 Cerebellum:            Previously seen        Stomach:                Appears normal, left
                                                                       sided
 Posterior Fossa:       Previously seen        Abdomen:                Appears normal
 Nuchal Fold:           Not applicable (>20    Abdominal Wall:         Previously seen
                        wks GA)
 Face:                  Orbits and profile     Cord Vessels:           2 vessel cord,
                        previously seen
                                                                       absent right Roberto Alex Aucapan
 Lips:                  Appears normal         Kidneys:                Appear normal
 Palate:                Not well visualized    Bladder:                Appears normal
 Thoracic:              Appears normal         Spine:                  Previously seen
 Heart:                 Appears normal         Upper Extremities:      Previously seen
                        (4CH, axis, and
                        situs)
 RVOT:                  Previously seen        Lower Extremities:      Previously seen

 Other:  Female gender previously seen. Nasal bone, 3VV,  and 3VTV
         previously visualized.
Doppler - Fetal Vessels

 Umbilical Artery
  S/D     %tile      RI    %tile      PI    %tile     PSV    ADFV    RDFV
                                                    (cm/s)
  2.78       57    0.64       63    0.[REDACTED]      No      No

Cervix Uterus Adnexa

 Cervix
 Not visualized (advanced GA >69wks)
Impression

 Fetal growth restriction.  On ultrasound performed on
 04/24/2021, the estimated fetal weight was at the 7th
 percentile.

 On today's ultrasound, the estimated fetal weight is at the
 14th percentile.  Interval weight gain is 588 g over 3 weeks
 (good interval growth).  Amniotic fluid is normal and good
 fetal activity seen.  Antenatal testing is reassuring.  BPP [DATE].
 Umbilical artery Doppler showed normal forward diastolic flow.

 I reassured the couple of the findings.  We will discontinue
 weekly antenatal testing.

 Blood pressure today at her office is 106/57 mmHg.  Patient
 reports she does not have gestational diabetes.
Recommendations

 -An appointment was made for her to return in 3 weeks for
 fetal growth assessment.
 -If fetal growth restriction persists, we will continue weekly
 antenatal testing till delivery
                 [place], Armando Sousa
